# Patient Record
Sex: Female | Born: 1945 | Race: White | Hispanic: No | Marital: Married | Smoking: Never smoker
Health system: Southern US, Community
[De-identification: ages and names within clinical notes are randomized; demographics above are authoritative.]

## PROBLEM LIST (undated history)

## (undated) DIAGNOSIS — M1990 Unspecified osteoarthritis, unspecified site: Secondary | ICD-10-CM

## (undated) DIAGNOSIS — J45909 Unspecified asthma, uncomplicated: Secondary | ICD-10-CM

## (undated) DIAGNOSIS — F0390 Unspecified dementia without behavioral disturbance: Secondary | ICD-10-CM

## (undated) HISTORY — PX: TUBAL LIGATION: SHX77

---

## 2004-07-20 ENCOUNTER — Emergency Department (HOSPITAL_COMMUNITY): Admission: EM | Admit: 2004-07-20 | Discharge: 2004-07-20 | Payer: Self-pay | Admitting: Emergency Medicine

## 2005-04-25 ENCOUNTER — Emergency Department (HOSPITAL_COMMUNITY): Admission: EM | Admit: 2005-04-25 | Discharge: 2005-04-25 | Payer: Self-pay | Admitting: Family Medicine

## 2005-08-15 ENCOUNTER — Emergency Department (HOSPITAL_COMMUNITY): Admission: EM | Admit: 2005-08-15 | Discharge: 2005-08-15 | Payer: Self-pay | Admitting: Emergency Medicine

## 2009-09-20 ENCOUNTER — Emergency Department (HOSPITAL_COMMUNITY): Admission: EM | Admit: 2009-09-20 | Discharge: 2009-09-20 | Payer: Self-pay | Admitting: Family Medicine

## 2011-11-20 ENCOUNTER — Emergency Department (HOSPITAL_COMMUNITY)
Admission: EM | Admit: 2011-11-20 | Discharge: 2011-11-22 | Disposition: A | Discharge status: Other Acute Inpatient Hospital | Payer: Medicare Other | Attending: Emergency Medicine | Admitting: Emergency Medicine

## 2011-11-20 ENCOUNTER — Encounter (HOSPITAL_COMMUNITY): Payer: Self-pay | Admitting: *Deleted

## 2011-11-20 DIAGNOSIS — F603 Borderline personality disorder: Secondary | ICD-10-CM | POA: Insufficient documentation

## 2011-11-20 DIAGNOSIS — R451 Restlessness and agitation: Secondary | ICD-10-CM

## 2011-11-20 DIAGNOSIS — J45909 Unspecified asthma, uncomplicated: Secondary | ICD-10-CM | POA: Insufficient documentation

## 2011-11-20 DIAGNOSIS — IMO0002 Reserved for concepts with insufficient information to code with codable children: Secondary | ICD-10-CM | POA: Insufficient documentation

## 2011-11-20 DIAGNOSIS — Z79899 Other long term (current) drug therapy: Secondary | ICD-10-CM | POA: Insufficient documentation

## 2011-11-20 DIAGNOSIS — F068 Other specified mental disorders due to known physiological condition: Secondary | ICD-10-CM | POA: Insufficient documentation

## 2011-11-20 HISTORY — DX: Unspecified osteoarthritis, unspecified site: M19.90

## 2011-11-20 HISTORY — DX: Unspecified asthma, uncomplicated: J45.909

## 2011-11-20 HISTORY — DX: Unspecified dementia, unspecified severity, without behavioral disturbance, psychotic disturbance, mood disturbance, and anxiety: F03.90

## 2011-11-20 LAB — BASIC METABOLIC PANEL
BUN: 17 mg/dL (ref 6–23)
CO2: 27 mEq/L (ref 19–32)
Calcium: 10.1 mg/dL (ref 8.4–10.5)
Chloride: 104 mEq/L (ref 96–112)
Creatinine, Ser: 0.89 mg/dL (ref 0.50–1.10)
GFR calc Af Amer: 76 mL/min — ABNORMAL LOW (ref >90)
GFR calc non Af Amer: 66 mL/min — ABNORMAL LOW (ref >90)
Glucose, Bld: 105 mg/dL — ABNORMAL HIGH (ref 70–99)
Potassium: 3.9 mEq/L (ref 3.5–5.1)
Sodium: 143 mEq/L (ref 135–145)

## 2011-11-20 LAB — DIFFERENTIAL
Basophils Absolute: 0 10*3/uL (ref 0.0–0.1)
Eosinophils Absolute: 0.1 10*3/uL (ref 0.0–0.7)
Lymphocytes Relative: 19 % (ref 12–46)
Monocytes Absolute: 0.6 10*3/uL (ref 0.1–1.0)
Neutro Abs: 6.5 10*3/uL (ref 1.7–7.7)
Neutrophils Relative %: 74 % (ref 43–77)

## 2011-11-20 LAB — CBC
MCHC: 34 g/dL (ref 30.0–36.0)
Platelets: 248 10*3/uL (ref 150–400)
RDW: 13 % (ref 11.5–15.5)
WBC: 8.8 10*3/uL (ref 4.0–10.5)

## 2011-11-20 MED ORDER — IBUPROFEN 200 MG PO TABS: 600.0000 mg | ORAL_TABLET | Freq: Three times a day (TID) | ORAL | Status: DC | PRN

## 2011-11-20 MED ORDER — ACETAMINOPHEN 325 MG PO TABS
650.0000 mg | ORAL_TABLET | ORAL | Status: DC | PRN
  Administered 2011-11-21: 650 mg via ORAL
  Filled 2011-11-20: qty 2

## 2011-11-20 MED ORDER — ALUM & MAG HYDROXIDE-SIMETH 200-200-20 MG/5ML PO SUSP: 30.0000 mL | ORAL | Status: DC | PRN

## 2011-11-20 MED ORDER — ZIPRASIDONE MESYLATE 20 MG IM SOLR
10.0000 mg | Freq: Once | INTRAMUSCULAR | Status: AC
  Administered 2011-11-20: 10 mg via INTRAMUSCULAR
  Filled 2011-11-20: qty 20

## 2011-11-20 MED ORDER — HALOPERIDOL LACTATE 5 MG/ML IJ SOLN
INTRAMUSCULAR | Status: AC
  Filled 2011-11-20: qty 1

## 2011-11-20 MED ORDER — LORAZEPAM 2 MG/ML IJ SOLN
INTRAMUSCULAR | Status: AC
  Filled 2011-11-20: qty 1

## 2011-11-20 MED ORDER — ONDANSETRON HCL 8 MG PO TABS: 4.0000 mg | ORAL_TABLET | Freq: Three times a day (TID) | ORAL | Status: DC | PRN

## 2011-11-20 NOTE — ED Notes (Signed)
Personal belongings inventoried and secured in ED locker #12

## 2011-11-20 NOTE — ED Notes (Signed)
Pts son, Lashawne Dura, cell phone [phone]
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
## 2011-11-20 NOTE — ED Provider Notes (Signed)
History     CSN: [id number]  Arrival date & time 11/20/11  1246   First MD Initiated Contact with Patient 11/20/11 1401      Chief Complaint  Patient presents with  . Psychiatric Evaluation  . Aggressive Behavior  . Agitation  . Dementia    (Consider location/radiation/quality/duration/timing/severity/associated sxs/prior treatment) HPI The patient presents to the ED today due to altered mental status and increased agitation toward nursing home staff.  She is accompanied by her son.  The patient complains of the staff at her nursing home and keeps referring to "needing to take care of her mother."  The patient states that she is perfectly fine and threatened to leave the hospital.  The son convinced her to stay for the time being, but she refused to allow any physical exam or answer any medical questions.   She has a history of dementia.    Past Medical History  Diagnosis Date  . Dementia   . Asthma   . Arthritis     History reviewed. No pertinent past surgical history.  History reviewed. No pertinent family history.  History  Substance Use Topics  . Smoking status: Not on file  . Smokeless tobacco: Not on file  . Alcohol Use: No    OB History    Grav Para Term Preterm Abortions TAB SAB Ect Mult Living                  Review of Systems  Unable to assess due to patient's unwillingness to respond to medical questions. Allergies  Review of patient's allergies indicates not on file.  Home Medications   Current Outpatient Rx  Name Route Sig Dispense Refill  . DIVALPROEX SODIUM 125 MG PO TBEC Oral Take 125 mg by mouth 2 (two) times daily.    . DONEPEZIL HCL 5 MG PO TABS Oral Take 5 mg by mouth at bedtime.    Marland Kitchen MONTELUKAST SODIUM 10 MG PO TABS Oral Take 10 mg by mouth at bedtime.    Marland Kitchen RISPERIDONE 0.5 MG PO TABS Oral Take 0.5 mg by mouth at bedtime.    . SERTRALINE HCL 100 MG PO TABS Oral Take 100 mg by mouth daily.      BP 115/62  Pulse 88  Temp 99 F (37.2  C) (Oral)  Resp 16  SpO2 96%  Physical Exam  Constitutional: She appears well-developed and well-nourished. No distress.  Neurological: She is alert.  Psychiatric: Her affect is angry and labile. She is agitated and aggressive. Cognition and memory are impaired.    ED Course  Procedures (including critical care time)   Labs Reviewed  CBC WITH DIFFERENTIAL  BASIC METABOLIC PANEL  URINALYSIS, ROUTINE W REFLEX MICROSCOPIC  URINE CULTURE  Patient will not let me examine her because of her agitation. ACT Team will see the patient.  MDM          Carlyle Dolly, PA-C 11/20/11 [phone]

## 2011-11-20 NOTE — ED Provider Notes (Signed)
Medical screening examination/treatment/procedure(s) were performed by non-physician practitioner and as supervising physician I was immediately available for consultation/collaboration.  John-Adam Donnell Beauchamp, M.D.     John-Adam Pauletta Pickney, MD 11/20/11 2223 

## 2011-11-20 NOTE — ED Notes (Signed)
To ED for eval of altered loc. Pt is a new resident at [facility]. Brought to ED for eval of increasing AMS. Pt has a hx of dementia and per NH notes pt has outbursts of agitation and yelling at residence.

## 2011-11-21 ENCOUNTER — Emergency Department (HOSPITAL_COMMUNITY): Payer: Medicare Other

## 2011-11-21 MED ORDER — ZIPRASIDONE MESYLATE 20 MG IM SOLR
10.0000 mg | Freq: Once | INTRAMUSCULAR | Status: AC
Start: 2011-11-21 — End: 2011-11-21
  Administered 2011-11-21: 10 mg via INTRAMUSCULAR
  Filled 2011-11-21: qty 20

## 2011-11-21 MED ORDER — MONTELUKAST SODIUM 10 MG PO TABS
10.0000 mg | ORAL_TABLET | Freq: Every day | ORAL | Status: DC
  Administered 2011-11-21: 10 mg via ORAL
  Filled 2011-11-21: qty 1

## 2011-11-21 MED ORDER — DIVALPROEX SODIUM 125 MG PO DR TAB
125.0000 mg | DELAYED_RELEASE_TABLET | Freq: Two times a day (BID) | ORAL | Status: DC
  Administered 2011-11-21: 125 mg via ORAL
  Filled 2011-11-21 (×2): qty 1

## 2011-11-21 MED ORDER — RISPERIDONE 0.5 MG PO TABS
0.5000 mg | ORAL_TABLET | Freq: Every day | ORAL | Status: DC
  Administered 2011-11-21: 0.5 mg via ORAL
  Filled 2011-11-21: qty 1

## 2011-11-21 MED ORDER — ZIPRASIDONE MESYLATE 20 MG IM SOLR
10.0000 mg | Freq: Two times a day (BID) | INTRAMUSCULAR | Status: DC | PRN
  Administered 2011-11-21: 10 mg via INTRAMUSCULAR

## 2011-11-21 MED ORDER — DONEPEZIL HCL 5 MG PO TABS
5.0000 mg | ORAL_TABLET | Freq: Every day | ORAL | Status: DC
  Administered 2011-11-21: 5 mg via ORAL
  Filled 2011-11-21: qty 1

## 2011-11-21 MED ORDER — LORAZEPAM 2 MG/ML IJ SOLN
2.0000 mg | Freq: Once | INTRAMUSCULAR | Status: AC
  Administered 2011-11-21: 2 mg via INTRAMUSCULAR
  Filled 2011-11-21: qty 1

## 2011-11-21 MED ORDER — SERTRALINE HCL 100 MG PO TABS
100.0000 mg | ORAL_TABLET | Freq: Every day | ORAL | Status: DC
  Administered 2011-11-21: 100 mg via ORAL
  Filled 2011-11-21 (×2): qty 1

## 2011-11-21 NOTE — ED Notes (Signed)
Husband phoned to check on patient. Updated him on events of the night (behavior, restraints, plan of care) in which he verbalized understanding. All questions answered. Encouraged husband to phone back later this morning/early afternoon for further updates on pt's (wife) plan of care.

## 2011-11-21 NOTE — ED Notes (Signed)
Husband Samanthamarie Ezzell cell number [phone]

## 2011-11-21 NOTE — ED Notes (Signed)
Waist band applied to patient while sitting on the edge of bed eating lunch for safety. Sitter remains at bedside.

## 2011-11-21 NOTE — BH Assessment (Signed)
[facility] Assessment Progress Note      Update:  [facility] Dept. cannot transport tonight per Sgt. Paschal to [employer].  Pt will have to be transported in the AM.  Regan Rakers and per Clare @ 2130, pt will still have a bed in the AM.  ED staff to arrange transport in AM.

## 2011-11-21 NOTE — ED Notes (Signed)
Pt sleeping at this time, intermittantly sleeping per sitter at [facility]

## 2011-11-21 NOTE — ED Notes (Signed)
Patient transported to X-ray. Sitter remains at bedside

## 2011-11-21 NOTE — ED Notes (Signed)
Pt updated. Argumentative, confused. Speech non-sensical. Talking of "people due money, donations, not happy, ya'll have not been of help, hungry and ankles hurt, please leave".

## 2011-11-21 NOTE — ED Notes (Signed)
Husband updated via phone.

## 2011-11-21 NOTE — ED Notes (Signed)
[place] [facility] contacted for transport to [facility]. Informed there were five other patients in front of this one to be transported and will go in the morning. Attempted to contact [facility] and [facility] for transport but both counties unable to at this time. [facility] will pick pt up around 1030 in the morning.

## 2011-11-21 NOTE — BH Assessment (Signed)
Assessment Note  Update:  Received call from [facility] from [place] stating pt accepted to [facility] to Dr. Yetta Barre and pt could be transported there.  Updated EDP Campos and ED staff.  Pt to be transported via [place], as pt is IVC.  ED staff to arrange transport and notify pt's family.  Updated assessment disposition, completed assessment notification and faxed to [facility] to log.     Disposition:  Disposition Disposition of Patient: Inpatient treatment program Type of inpatient treatment program: Adult Patient referred to: Other (Comment) (Pt accepted [facility])  On Site Evaluation by:   Reviewed with Physician:  Elsie Lincoln, Rennis Harding 11/21/2011 5:20 PM
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
## 2011-11-21 NOTE — ED Notes (Signed)
Patient has returned from xray 

## 2011-11-21 NOTE — ED Notes (Signed)
Sleeping, sitter at BS. 

## 2011-11-21 NOTE — ED Notes (Signed)
Pt cleaned up and linen changed. Placed back in bed and given a warm blanket. Calmed back down at this time. Sitter remains at the bedside. Lunch reoffered and at the bedside as well.

## 2011-11-22 MED ORDER — HALOPERIDOL LACTATE 5 MG/ML IJ SOLN
INTRAMUSCULAR | Status: AC
  Administered 2011-11-22: 2 mg via INTRAMUSCULAR
  Filled 2011-11-22: qty 1

## 2011-11-22 MED ORDER — HALOPERIDOL LACTATE 5 MG/ML IJ SOLN
2.0000 mg | Freq: Once | INTRAMUSCULAR | Status: AC
  Administered 2011-11-22: 2 mg via INTRAMUSCULAR

## 2011-11-22 NOTE — ED Notes (Addendum)
Husband called and was updated. °

## 2011-11-22 NOTE — Progress Notes (Signed)
7:45 AM Awaiting transfer to [facility] unit.

## 2011-11-22 NOTE — ED Notes (Signed)
Sleeping, sitter at BS, no changes.  

## 2011-11-22 NOTE — ED Notes (Signed)
Sitter and security at bedside

## 2011-11-22 NOTE — ED Notes (Signed)
Sleeping, sitter at BS. 

## 2011-11-22 NOTE — Progress Notes (Signed)
9:03 AM Haldol 2 mg IM for agitation requiring personnel to keep her on her stretcher.

## 2011-11-22 NOTE — ED Notes (Signed)
[facility] here to transport pt to [facility] unit. Pt is much more cooperative and quieter at this time. Security and sitter remain at bedside

## 2011-11-22 NOTE — ED Notes (Signed)
Pt aggitated, walked out to hall. Ambulated pt around hall and attempted to reorient her to her room.  Pt eating breakfast at this time with sitter at bedside. Pt denies pain, [place]

## 2011-11-22 NOTE — ED Notes (Signed)
Sleeping sitter at [facility], no changes, sitter relieved by RN.

## 2011-11-22 NOTE — ED Notes (Signed)
Pt husband called  To check on patient. Pt has started acting out. Walking the halls and yelling. Security to Bedside, and Dr. Ignacia Palma notified. Pt is confused and disoriented x 4

## 2011-12-31 ENCOUNTER — Emergency Department (HOSPITAL_COMMUNITY)
Admission: EM | Admit: 2011-12-31 | Discharge: 2011-12-31 | Disposition: A | Discharge status: Home / Self Care | Payer: Medicare Other | Attending: Emergency Medicine | Admitting: Emergency Medicine

## 2011-12-31 ENCOUNTER — Emergency Department (HOSPITAL_COMMUNITY): Payer: Medicare Other

## 2011-12-31 ENCOUNTER — Encounter (HOSPITAL_COMMUNITY): Payer: Self-pay | Admitting: Emergency Medicine

## 2011-12-31 DIAGNOSIS — R4182 Altered mental status, unspecified: Secondary | ICD-10-CM | POA: Insufficient documentation

## 2011-12-31 DIAGNOSIS — M129 Arthropathy, unspecified: Secondary | ICD-10-CM | POA: Insufficient documentation

## 2011-12-31 DIAGNOSIS — J45909 Unspecified asthma, uncomplicated: Secondary | ICD-10-CM | POA: Insufficient documentation

## 2011-12-31 DIAGNOSIS — N390 Urinary tract infection, site not specified: Secondary | ICD-10-CM

## 2011-12-31 DIAGNOSIS — Z79899 Other long term (current) drug therapy: Secondary | ICD-10-CM | POA: Insufficient documentation

## 2011-12-31 DIAGNOSIS — F0390 Unspecified dementia without behavioral disturbance: Secondary | ICD-10-CM | POA: Insufficient documentation

## 2011-12-31 LAB — CBC WITH DIFFERENTIAL/PLATELET
Basophils Absolute: 0 10*3/uL (ref 0.0–0.1)
Eosinophils Absolute: 0 10*3/uL (ref 0.0–0.7)
Lymphocytes Relative: 23 % (ref 12–46)
Lymphs Abs: 1 10*3/uL (ref 0.7–4.0)
Neutrophils Relative %: 57 % (ref 43–77)
Platelets: 235 10*3/uL (ref 150–400)
RBC: 4.09 MIL/uL (ref 3.87–5.11)
WBC: 4.4 10*3/uL (ref 4.0–10.5)

## 2011-12-31 LAB — COMPREHENSIVE METABOLIC PANEL
ALT: 39 U/L — ABNORMAL HIGH (ref 0–35)
AST: 35 U/L (ref 0–37)
Alkaline Phosphatase: 105 U/L (ref 39–117)
CO2: 24 mEq/L (ref 19–32)
GFR calc Af Amer: >90 mL/min (ref >90)
Glucose, Bld: 119 mg/dL — ABNORMAL HIGH (ref 70–99)
Potassium: 3.6 mEq/L (ref 3.5–5.1)
Sodium: 143 mEq/L (ref 135–145)
Total Protein: 7 g/dL (ref 6.0–8.3)

## 2011-12-31 LAB — URINALYSIS, ROUTINE W REFLEX MICROSCOPIC
Bilirubin Urine: NEGATIVE
Glucose, UA: NEGATIVE mg/dL
Ketones, ur: 15 mg/dL — AB
Nitrite: NEGATIVE
pH: 7 (ref 5.0–8.0)

## 2011-12-31 LAB — URINE MICROSCOPIC-ADD ON

## 2011-12-31 MED ORDER — CEPHALEXIN 500 MG PO CAPS: 500.0000 mg | ORAL_CAPSULE | Freq: Three times a day (TID) | ORAL | Status: DC

## 2011-12-31 MED ORDER — CEPHALEXIN 500 MG PO CAPS: 500.0000 mg | ORAL_CAPSULE | Freq: Two times a day (BID) | ORAL | Status: DC

## 2011-12-31 MED ORDER — DEXTROSE 5 % IV SOLN
1.0000 g | Freq: Once | INTRAVENOUS | Status: AC
  Administered 2011-12-31: 1 g via INTRAVENOUS
  Filled 2011-12-31: qty 10

## 2011-12-31 MED ORDER — SODIUM CHLORIDE 0.9 % IV SOLN
INTRAVENOUS | Status: DC
  Administered 2011-12-31: 14:00:00 via INTRAVENOUS

## 2011-12-31 MED ORDER — SODIUM CHLORIDE 0.9 % IV BOLUS (SEPSIS)
1000.0000 mL | Freq: Once | INTRAVENOUS | Status: AC
  Administered 2011-12-31: 1000 mL via INTRAVENOUS
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
## 2011-12-31 NOTE — ED Notes (Signed)
Report received from Alyssa Grove, RN at this time. Pt to come to CDU 10.

## 2011-12-31 NOTE — ED Notes (Signed)
Pt to CT at this time.

## 2011-12-31 NOTE — ED Notes (Signed)
Tried an in and out cath twice and was unsuccessful in getting urine return

## 2011-12-31 NOTE — ED Notes (Signed)
Patients husband reports that patient stays at [facility] and the patient was admitted to [facility] medical center for a long stay on the behavioral unit. Patient was started on medications to control outburst and once she returned to [facility] her baseline was very different, not walking and feeding herself and not interactive. Wednesday and Thursday of a week and a half ago she started this change and then had a fall on Sunday night a week ago. Staff said the fall wasn't a significant fall and patient was not sent for evaluation after the fall. The past week she has remained not interactive

## 2011-12-31 NOTE — ED Notes (Signed)
Pt returned from CT.  Pascal Lux Wingin, PA at pt bedside.

## 2011-12-31 NOTE — ED Notes (Signed)
Report called to [facility] RN in CDU and moving patient to room 10

## 2012-01-01 NOTE — ED Provider Notes (Signed)
Medical screening examination/treatment/procedure(s) were conducted as a shared visit with non-physician practitioner(s) and myself.  I personally evaluated the patient during the encounter   Roslynn Holte B. Bernette Mayers, MD 01/01/12 [phone]

## 2012-01-02 LAB — URINE CULTURE: Colony Count: >=100000

## 2012-01-03 NOTE — ED Notes (Signed)
+   urine  Patient treated appropriately -sensitive to same-chart appended per protocol MD.  

## 2012-04-08 ENCOUNTER — Encounter (HOSPITAL_COMMUNITY): Payer: Self-pay | Admitting: *Deleted

## 2012-04-08 ENCOUNTER — Inpatient Hospital Stay (HOSPITAL_COMMUNITY)
Admission: EM | Admit: 2012-04-08 | Discharge: 2012-04-11 | DRG: 470 | Disposition: A | Discharge status: Home Health | Payer: Medicare Other | Attending: Internal Medicine | Admitting: Internal Medicine

## 2012-04-08 ENCOUNTER — Emergency Department (HOSPITAL_COMMUNITY): Payer: Medicare Other

## 2012-04-08 DIAGNOSIS — Z7901 Long term (current) use of anticoagulants: Secondary | ICD-10-CM

## 2012-04-08 DIAGNOSIS — E876 Hypokalemia: Secondary | ICD-10-CM | POA: Diagnosis present

## 2012-04-08 DIAGNOSIS — Y9210 Unspecified residential institution as the place of occurrence of the external cause: Secondary | ICD-10-CM | POA: Diagnosis present

## 2012-04-08 DIAGNOSIS — Z79899 Other long term (current) drug therapy: Secondary | ICD-10-CM

## 2012-04-08 DIAGNOSIS — S72002A Fracture of unspecified part of neck of left femur, initial encounter for closed fracture: Secondary | ICD-10-CM

## 2012-04-08 DIAGNOSIS — J45909 Unspecified asthma, uncomplicated: Secondary | ICD-10-CM | POA: Diagnosis present

## 2012-04-08 DIAGNOSIS — S72033A Displaced midcervical fracture of unspecified femur, initial encounter for closed fracture: Principal | ICD-10-CM | POA: Diagnosis present

## 2012-04-08 DIAGNOSIS — X58XXXA Exposure to other specified factors, initial encounter: Secondary | ICD-10-CM | POA: Diagnosis present

## 2012-04-08 DIAGNOSIS — F0390 Unspecified dementia without behavioral disturbance: Secondary | ICD-10-CM | POA: Diagnosis present

## 2012-04-08 LAB — BASIC METABOLIC PANEL
BUN: 18 mg/dL (ref 6–23)
CO2: 26 mEq/L (ref 19–32)
Chloride: 103 mEq/L (ref 96–112)
Creatinine, Ser: 0.67 mg/dL (ref 0.50–1.10)
Glucose, Bld: 128 mg/dL — ABNORMAL HIGH (ref 70–99)
Potassium: 3.7 mEq/L (ref 3.5–5.1)

## 2012-04-08 LAB — CBC WITH DIFFERENTIAL/PLATELET
HCT: 35.8 % — ABNORMAL LOW (ref 36.0–46.0)
Hemoglobin: 11.8 g/dL — ABNORMAL LOW (ref 12.0–15.0)
Lymphocytes Relative: 26 % (ref 12–46)
Lymphs Abs: 2.3 10*3/uL (ref 0.7–4.0)
MCHC: 33 g/dL (ref 30.0–36.0)
Monocytes Absolute: 0.7 10*3/uL (ref 0.1–1.0)
Monocytes Relative: 7 % (ref 3–12)
Neutro Abs: 5.7 10*3/uL (ref 1.7–7.7)
RBC: 3.92 MIL/uL (ref 3.87–5.11)
WBC: 8.8 10*3/uL (ref 4.0–10.5)

## 2012-04-08 LAB — PROTIME-INR
INR: 0.94 (ref 0.00–1.49)
Prothrombin Time: 12.5 seconds (ref 11.6–15.2)

## 2012-04-08 LAB — TYPE AND SCREEN

## 2012-04-08 MED ORDER — LORAZEPAM 2 MG/ML IJ SOLN
1.0000 mg | Freq: Once | INTRAMUSCULAR | Status: AC
  Administered 2012-04-08: 1 mg via INTRAVENOUS
  Filled 2012-04-08: qty 1

## 2012-04-08 MED ORDER — MORPHINE SULFATE 4 MG/ML IJ SOLN
6.0000 mg | Freq: Once | INTRAMUSCULAR | Status: AC
  Administered 2012-04-08: 6 mg via INTRAVENOUS
  Filled 2012-04-08: qty 2
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
## 2012-04-08 NOTE — ED Provider Notes (Addendum)
History     CSN: [id number]  Arrival date & time 04/08/12  2028   First MD Initiated Contact with Patient 04/08/12 2134      Chief Complaint  Patient presents with  . Hip Pain    (Consider location/radiation/quality/duration/timing/severity/associated sxs/prior treatment) The history is provided by the patient.  Kiara Gilbert is a 67 y.o. female hx of dementia in [place] here with L hip pain. As per husband, she has been having intermittent left hip pain. She has not been walking well since late December. She was not progressing well with physical therapy so an x-ray was done in nursing home that showed a left hip fracture.   Level V caveat- dementia   Past Medical History  Diagnosis Date  . Dementia   . Asthma   . Arthritis     History reviewed. No pertinent past surgical history.  No family history on file.  History  Substance Use Topics  . Smoking status: Not on file  . Smokeless tobacco: Not on file  . Alcohol Use: No    OB History    Grav Para Term Preterm Abortions TAB SAB Ect Mult Living                  Review of Systems  Musculoskeletal:       L leg pain   All other systems reviewed and are negative.    Allergies  Review of patient's allergies indicates no known allergies.  Home Medications   Current Outpatient Rx  Name  Route  Sig  Dispense  Refill  . ACETAMINOPHEN 500 MG PO TABS   Oral   Take 500 mg by mouth 3 (three) times daily.         . ALBUTEROL SULFATE HFA 108 (90 BASE) MCG/ACT IN AERS   Inhalation   Inhale 2 puffs into the lungs every 4 (four) hours as needed. For shortness of breath         . ALPRAZOLAM 0.25 MG PO TABS   Oral   Take 0.5 mg by mouth at bedtime.          . ALPRAZOLAM 0.25 MG PO TABS   Oral   Take 0.25 mg by mouth 3 (three) times daily as needed. For agitation         . CRANBERRY 425 MG PO CAPS   Oral   Take 1 capsule by mouth daily.         . DONEPEZIL HCL 10 MG PO TABS   Oral   Take  10 mg by mouth at bedtime.         . FOLGARD PO TABS   Oral   Take 1 tablet by mouth daily.         Marland Kitchen LACTINEX PO CHEW   Oral   Chew 4 tablets by mouth 3 (three) times daily with meals.         Marland Kitchen MONTELUKAST SODIUM 10 MG PO TABS   Oral   Take 10 mg by mouth at bedtime.         Marland Kitchen POLYETHYLENE GLYCOL 3350 PO PACK   Oral   Take 17 g by mouth daily.         Marland Kitchen POTASSIUM CHLORIDE CRYS ER 20 MEQ PO TBCR   Oral   Take 20 mEq by mouth every morning.         Marland Kitchen SERTRALINE HCL 100 MG PO TABS   Oral   Take 100 mg by mouth  every morning.            BP 127/65  Pulse 84  Temp 98.9 F (37.2 C)  Resp 18  SpO2 97%  Physical Exam  Nursing note and vitals reviewed. Constitutional: She appears well-developed and well-nourished.       Calm,   HENT:  Head: Normocephalic.  Mouth/Throat: Oropharynx is clear and moist.  Eyes: Conjunctivae normal are normal. Pupils are equal, round, and reactive to light.  Neck: Normal range of motion. Neck supple.  Cardiovascular: Normal rate, regular rhythm and normal heart sounds.   Pulmonary/Chest: Effort normal and breath sounds normal. No respiratory distress. She has no wheezes. She has no rales.  Abdominal: Soft. Bowel sounds are normal. She exhibits no distension. There is no tenderness. There is no rebound.  Musculoskeletal:       L hip shortened. 2+ pulses. Able to move ankle.   Neurological: She is alert.       Demented   Skin: Skin is warm.  Psychiatric:       Unable     ED Course  Procedures (including critical care time)  Labs Reviewed  CBC WITH DIFFERENTIAL - Abnormal; Notable for the following:    Hemoglobin 11.8 (*)     HCT 35.8 (*)     All other components within normal limits  BASIC METABOLIC PANEL - Abnormal; Notable for the following:    Glucose, Bld 128 (*)     GFR calc non Af Amer 88 (*)     All other components within normal limits  TYPE AND SCREEN  PROTIME-INR   Dg Hip Complete Left  04/08/2012   *RADIOLOGY REPORT*  Clinical Data: Left hip pain; known left hip fracture.  LEFT HIP - COMPLETE 2+ VIEW  Comparison: None.  Findings: There is a markedly displaced transcervical fracture involving the left femoral neck, with well more than one shaft width superior displacement of the distal femur.  Surrounding heterotopic bone formation suggests that this fracture is subacute or chronic.  The left femoral head remains seated at the acetabulum.  No additional fractures are seen.  The right hip joint is grossly unremarkable in appearance.  Mild sclerotic change is seen at the sacroiliac joints.  The visualized bowel gas pattern is grossly unremarkable.  IMPRESSION: Markedly displaced transcervical fracture involving the left femoral neck, with well more than one shaft width superior displacement of the distal femur.  Surrounding heterotopic bone formation suggests that this fracture is subacute or chronic.   Original Report Authenticated By: Tonia Ghent, M.D.      No diagnosis found.   Date: 04/08/2012  Rate: 93  Rhythm: normal sinus rhythm  QRS Axis: normal  Intervals: normal  ST/T Wave abnormalities: normal  Conduction Disutrbances:none  Narrative Interpretation:   Old EKG Reviewed: unchanged    MDM  Kiara Gilbert is a 67 y.o. female here with L hip pain. Xray showed L femoral neck fracture that appears chronic. Will get ortho consult and get preop labs.   11:05 PM I called Dr. Shon Baton, ortho, who will see patient tomorrow. Will keep NPO after midnight. Will admit to medicine. I called Dr. Toniann Fail who will accepted her on his service.          Richardean Canal, MD 04/08/12 2313  Richardean Canal, MD 04/08/12 [phone]

## 2012-04-09 ENCOUNTER — Encounter (HOSPITAL_COMMUNITY)
Admission: EM | Disposition: A | Discharge status: Home Health | Payer: Self-pay | Source: Home / Self Care | Attending: Internal Medicine

## 2012-04-09 ENCOUNTER — Inpatient Hospital Stay (HOSPITAL_COMMUNITY): Payer: Medicare Other | Admitting: Anesthesiology

## 2012-04-09 ENCOUNTER — Encounter (HOSPITAL_COMMUNITY): Payer: Self-pay | Admitting: Internal Medicine

## 2012-04-09 ENCOUNTER — Encounter (HOSPITAL_COMMUNITY): Payer: Self-pay | Admitting: Anesthesiology

## 2012-04-09 DIAGNOSIS — F0390 Unspecified dementia without behavioral disturbance: Secondary | ICD-10-CM | POA: Diagnosis present

## 2012-04-09 DIAGNOSIS — S72009A Fracture of unspecified part of neck of unspecified femur, initial encounter for closed fracture: Secondary | ICD-10-CM

## 2012-04-09 DIAGNOSIS — S72002A Fracture of unspecified part of neck of left femur, initial encounter for closed fracture: Secondary | ICD-10-CM | POA: Diagnosis present

## 2012-04-09 DIAGNOSIS — J45909 Unspecified asthma, uncomplicated: Secondary | ICD-10-CM | POA: Diagnosis present

## 2012-04-09 HISTORY — PX: HIP ARTHROPLASTY: SHX981

## 2012-04-09 LAB — URINALYSIS, MICROSCOPIC ONLY
Ketones, ur: NEGATIVE mg/dL
Leukocytes, UA: NEGATIVE
Protein, ur: NEGATIVE mg/dL
Urobilinogen, UA: 0.2 mg/dL (ref 0.0–1.0)

## 2012-04-09 LAB — CBC
Hemoglobin: 10.9 g/dL — ABNORMAL LOW (ref 12.0–15.0)
MCH: 29.2 pg (ref 26.0–34.0)
MCV: 91.4 fL (ref 78.0–100.0)
Platelets: 274 10*3/uL (ref 150–400)
RBC: 3.73 MIL/uL — ABNORMAL LOW (ref 3.87–5.11)

## 2012-04-09 LAB — BASIC METABOLIC PANEL
CO2: 26 mEq/L (ref 19–32)
Calcium: 8.8 mg/dL (ref 8.4–10.5)
Glucose, Bld: 107 mg/dL — ABNORMAL HIGH (ref 70–99)
Sodium: 141 mEq/L (ref 135–145)

## 2012-04-09 LAB — GLUCOSE, CAPILLARY: Glucose-Capillary: 85 mg/dL (ref 70–99)

## 2012-04-09 SURGERY — HEMIARTHROPLASTY, HIP, DIRECT ANTERIOR APPROACH, FOR FRACTURE
Anesthesia: General | Site: Hip | Laterality: Left | Wound class: Clean

## 2012-04-09 MED ORDER — FENTANYL CITRATE 0.05 MG/ML IJ SOLN
INTRAMUSCULAR | Status: DC | PRN
  Administered 2012-04-09: 100 ug via INTRAVENOUS

## 2012-04-09 MED ORDER — ONDANSETRON HCL 4 MG/2ML IJ SOLN: 4.0000 mg | Freq: Three times a day (TID) | INTRAMUSCULAR | Status: DC | PRN

## 2012-04-09 MED ORDER — HYDROMORPHONE HCL PF 1 MG/ML IJ SOLN: 1.0000 mg | INTRAMUSCULAR | Status: DC | PRN

## 2012-04-09 MED ORDER — ACETAMINOPHEN 650 MG RE SUPP: 650.0000 mg | Freq: Four times a day (QID) | RECTAL | Status: DC | PRN

## 2012-04-09 MED ORDER — SODIUM CHLORIDE 0.9 % IV SOLN
INTRAVENOUS | Status: AC
  Administered 2012-04-09: 01:00:00 via INTRAVENOUS

## 2012-04-09 MED ORDER — MONTELUKAST SODIUM 10 MG PO TABS
10.0000 mg | ORAL_TABLET | Freq: Every day | ORAL | Status: DC
  Administered 2012-04-10: 10 mg via ORAL
  Filled 2012-04-09 (×4): qty 1

## 2012-04-09 MED ORDER — LIDOCAINE HCL (CARDIAC) 20 MG/ML IV SOLN
INTRAVENOUS | Status: DC | PRN
  Administered 2012-04-09: 80 mg via INTRAVENOUS

## 2012-04-09 MED ORDER — ALPRAZOLAM 0.5 MG PO TABS
0.5000 mg | ORAL_TABLET | Freq: Every day | ORAL | Status: DC
  Administered 2012-04-10: 0.5 mg via ORAL
  Filled 2012-04-09 (×3): qty 1

## 2012-04-09 MED ORDER — POTASSIUM CHLORIDE CRYS ER 20 MEQ PO TBCR
20.0000 meq | EXTENDED_RELEASE_TABLET | Freq: Every morning | ORAL | Status: DC
  Administered 2012-04-10 – 2012-04-11 (×2): 20 meq via ORAL
  Filled 2012-04-09: qty 3
  Filled 2012-04-09 (×4): qty 1

## 2012-04-09 MED ORDER — ACETAMINOPHEN 325 MG PO TABS: 650.0000 mg | ORAL_TABLET | Freq: Four times a day (QID) | ORAL | Status: DC | PRN

## 2012-04-09 MED ORDER — SUCCINYLCHOLINE CHLORIDE 20 MG/ML IJ SOLN
INTRAMUSCULAR | Status: DC | PRN
  Administered 2012-04-09: 100 mg via INTRAVENOUS

## 2012-04-09 MED ORDER — DEXTROSE-NACL 5-0.9 % IV SOLN
INTRAVENOUS | Status: DC
  Administered 2012-04-10: 20:00:00 via INTRAVENOUS

## 2012-04-09 MED ORDER — PROPOFOL 10 MG/ML IV BOLUS
INTRAVENOUS | Status: DC | PRN
  Administered 2012-04-09: 120 mg via INTRAVENOUS

## 2012-04-09 MED ORDER — LACTINEX PO CHEW
4.0000 | CHEWABLE_TABLET | Freq: Three times a day (TID) | ORAL | Status: DC
  Administered 2012-04-10 – 2012-04-11 (×3): 4 via ORAL
  Filled 2012-04-09 (×11): qty 4

## 2012-04-09 MED ORDER — HYDROMORPHONE HCL PF 1 MG/ML IJ SOLN
0.5000 mg | INTRAMUSCULAR | Status: DC | PRN
  Administered 2012-04-09: 0.5 mg via INTRAVENOUS
  Filled 2012-04-09: qty 1

## 2012-04-09 MED ORDER — CEFAZOLIN SODIUM 1-5 GM-% IV SOLN
INTRAVENOUS | Status: DC | PRN
  Administered 2012-04-09: 1 g via INTRAVENOUS

## 2012-04-09 MED ORDER — LACTATED RINGERS IV SOLN
INTRAVENOUS | Status: DC | PRN
  Administered 2012-04-09: 22:00:00 via INTRAVENOUS

## 2012-04-09 MED ORDER — ONDANSETRON HCL 4 MG/2ML IJ SOLN: 4.0000 mg | Freq: Four times a day (QID) | INTRAMUSCULAR | Status: DC | PRN

## 2012-04-09 MED ORDER — SODIUM CHLORIDE 0.9 % IV SOLN
10.0000 mg | INTRAVENOUS | Status: DC | PRN
  Administered 2012-04-09: 25 ug/min via INTRAVENOUS

## 2012-04-09 MED ORDER — ALPRAZOLAM 0.25 MG PO TABS
0.2500 mg | ORAL_TABLET | Freq: Three times a day (TID) | ORAL | Status: DC | PRN
  Administered 2012-04-09 – 2012-04-11 (×4): 0.25 mg via ORAL
  Filled 2012-04-09 (×4): qty 1

## 2012-04-09 MED ORDER — POLYETHYLENE GLYCOL 3350 17 G PO PACK
17.0000 g | PACK | Freq: Every day | ORAL | Status: DC
  Administered 2012-04-10 – 2012-04-11 (×2): 17 g via ORAL
  Filled 2012-04-09 (×3): qty 1

## 2012-04-09 MED ORDER — SODIUM CHLORIDE 0.9 % IR SOLN
Status: DC | PRN
  Administered 2012-04-09: 1000 mL

## 2012-04-09 MED ORDER — ONDANSETRON HCL 4 MG/2ML IJ SOLN
INTRAMUSCULAR | Status: DC | PRN
  Administered 2012-04-09: 4 mg via INTRAVENOUS

## 2012-04-09 MED ORDER — ONDANSETRON HCL 4 MG PO TABS: 4.0000 mg | ORAL_TABLET | Freq: Four times a day (QID) | ORAL | Status: DC | PRN

## 2012-04-09 MED ORDER — DONEPEZIL HCL 10 MG PO TABS
10.0000 mg | ORAL_TABLET | Freq: Every day | ORAL | Status: DC
  Administered 2012-04-10: 10 mg via ORAL
  Filled 2012-04-09 (×4): qty 1

## 2012-04-09 MED ORDER — ALBUTEROL SULFATE HFA 108 (90 BASE) MCG/ACT IN AERS: 2.0000 | INHALATION_SPRAY | RESPIRATORY_TRACT | Status: DC | PRN

## 2012-04-09 MED ORDER — SERTRALINE HCL 100 MG PO TABS
100.0000 mg | ORAL_TABLET | Freq: Every morning | ORAL | Status: DC
  Administered 2012-04-10 – 2012-04-11 (×2): 100 mg via ORAL
  Filled 2012-04-09 (×3): qty 1

## 2012-04-09 SURGICAL SUPPLY — 58 items
ADH SKN CLS APL DERMABOND .7 (GAUZE/BANDAGES/DRESSINGS) ×1
BLADE SAW SAG 73X25 THK (BLADE) ×1
BLADE SAW SGTL 73X25 THK (BLADE) ×1 IMPLANT
BNDG COHESIVE 6X5 TAN STRL LF (GAUZE/BANDAGES/DRESSINGS) ×1 IMPLANT
BRUSH FEMORAL CANAL (MISCELLANEOUS) IMPLANT
CATH URET WHISTLE 8FR 331008 (CATHETERS) ×1 IMPLANT
CLOTH BEACON ORANGE TIMEOUT ST (SAFETY) ×2 IMPLANT
COVER SURGICAL LIGHT HANDLE (MISCELLANEOUS) ×2 IMPLANT
DERMABOND ADVANCED (GAUZE/BANDAGES/DRESSINGS) ×1
DERMABOND ADVANCED .7 DNX12 (GAUZE/BANDAGES/DRESSINGS) IMPLANT
DRAPE INCISE IOBAN 66X45 STRL (DRAPES) ×1 IMPLANT
DRAPE INCISE IOBAN 85X60 (DRAPES) ×3 IMPLANT
DRAPE ORTHO SPLIT 77X108 STRL (DRAPES) ×4
DRAPE SURG ORHT 6 SPLT 77X108 (DRAPES) ×2 IMPLANT
DRAPE U-SHAPE 47X51 STRL (DRAPES) ×2 IMPLANT
DRSG MEPILEX BORDER 4X12 (GAUZE/BANDAGES/DRESSINGS) ×1 IMPLANT
DRSG MEPILEX BORDER 4X8 (GAUZE/BANDAGES/DRESSINGS) ×2 IMPLANT
DURAPREP 26ML APPLICATOR (WOUND CARE) ×2 IMPLANT
ELECT BLADE 4.0 EZ CLEAN MEGAD (MISCELLANEOUS) ×2
ELECT REM PT RETURN 9FT ADLT (ELECTROSURGICAL) ×2
ELECTRODE BLDE 4.0 EZ CLN MEGD (MISCELLANEOUS) IMPLANT
ELECTRODE REM PT RTRN 9FT ADLT (ELECTROSURGICAL) ×1 IMPLANT
EVACUATOR 1/8 PVC DRAIN (DRAIN) ×2 IMPLANT
FACESHIELD LNG OPTICON STERILE (SAFETY) ×4 IMPLANT
GLOVE BIOGEL PI IND STRL 7.5 (GLOVE) ×1 IMPLANT
GLOVE BIOGEL PI IND STRL 8 (GLOVE) ×2 IMPLANT
GLOVE BIOGEL PI INDICATOR 7.5 (GLOVE) ×1
GLOVE BIOGEL PI INDICATOR 8 (GLOVE) ×2
GLOVE ORTHO TXT STRL SZ7.5 (GLOVE) ×3 IMPLANT
GLOVE SURG ORTHO 8.0 STRL STRW (GLOVE) ×2 IMPLANT
GLOVE SURG SS PI 7.5 STRL IVOR (GLOVE) ×1 IMPLANT
GOWN STRL NON-REIN LRG LVL3 (GOWN DISPOSABLE) ×6 IMPLANT
HANDPIECE INTERPULSE COAX TIP (DISPOSABLE)
IMMOBILIZER KNEE 22 UNIV (SOFTGOODS) ×2 IMPLANT
KIT BASIN OR (CUSTOM PROCEDURE TRAY) ×2 IMPLANT
KIT ROOM TURNOVER OR (KITS) ×2 IMPLANT
MANIFOLD NEPTUNE II (INSTRUMENTS) ×2 IMPLANT
NS IRRIG 1000ML POUR BTL (IV SOLUTION) ×2 IMPLANT
PACK TOTAL JOINT (CUSTOM PROCEDURE TRAY) ×2 IMPLANT
PAD ARMBOARD 7.5X6 YLW CONV (MISCELLANEOUS) ×4 IMPLANT
PILLOW ABDUCTION HIP (SOFTGOODS) ×1 IMPLANT
PRESSURIZER FEMORAL UNIV (MISCELLANEOUS) IMPLANT
SET HNDPC FAN SPRY TIP SCT (DISPOSABLE) IMPLANT
SPECIMEN JAR MEDIUM (MISCELLANEOUS) ×2 IMPLANT
SPONGE LAP 4X18 X RAY DECT (DISPOSABLE) ×4 IMPLANT
STAPLER VISISTAT (STAPLE) ×2 IMPLANT
STAPLER VISISTAT 35W (STAPLE) IMPLANT
STRIP CLOSURE SKIN 1/2X4 (GAUZE/BANDAGES/DRESSINGS) ×2 IMPLANT
SUT MNCRL AB 4-0 PS2 18 (SUTURE) ×1 IMPLANT
SUT VIC AB 1 CT1 27 (SUTURE) ×6
SUT VIC AB 1 CT1 27XBRD ANBCTR (SUTURE) IMPLANT
SUT VIC AB 2-0 CT1 27 (SUTURE) ×4
SUT VIC AB 2-0 CT1 TAPERPNT 27 (SUTURE) IMPLANT
TOWEL OR 17X24 6PK STRL BLUE (TOWEL DISPOSABLE) ×2 IMPLANT
TOWEL OR 17X26 10 PK STRL BLUE (TOWEL DISPOSABLE) ×2 IMPLANT
TOWER CARTRIDGE SMART MIX (DISPOSABLE) IMPLANT
TRAY FOLEY CATH 14FR (SET/KITS/TRAYS/PACK) ×1 IMPLANT
WATER STERILE IRR 1000ML POUR (IV SOLUTION) ×8 IMPLANT

## 2012-04-09 NOTE — Progress Notes (Signed)
Patient ID: Kiara Gilbert, female   DOB: [birth year], 67 y.o.   MRN: [id number]   Asked to assist with definitive management of [name] left hip fracture.  Family known to me from past history of soccer.  Chart and history reviewed, significant for dementia  Displaced femoral neck fracture  Plan to go OR for palliative goals of pain support and improved function.  Plan for left hip hemiarthroplasty.  Reviewed risks and benefits with her husband, consent obtained for management of hip fracture.

## 2012-04-09 NOTE — Progress Notes (Signed)
INITIAL NUTRITION ASSESSMENT  DOCUMENTATION CODES Per approved criteria  -Not Applicable   INTERVENTION: When medically ready, advance diet to Mechanical soft with thin liquids  Add Ensure Complete bid when diet advanced.  NUTRITION DIAGNOSIS:  Inadequate oral intake related to inability to eat as evidenced by npo status.   Goal: Intake to meet >/=90% estimated needs  Monitor:  Diet advancement, labs, weight trend  Reason for Assessment: MST  67 y.o. female  Admitting Dx: Left displaced femoral neck fracture  ASSESSMENT: Patient from a dementia facility admitted with hip fx.  Spoke with patient's husband. Patient was on a Mechanical soft diet with thin liquids and Ensure tid  prior to admit.  Husband reported that she was on thick liquids in the past but had been off that for some time.  Patient usually ate well and no known weight loss and has always been slender.  Height: Ht Readings from Last 1 Encounters:  04/09/12 5\' 2"  (1.575 m)    Weight: Wt Readings from Last 1 Encounters:  04/09/12 116 lb 4.8 oz (52.753 kg)    Ideal Body Weight: 110 lbs  % Ideal Body Weight: 105  Wt Readings from Last 10 Encounters:  04/09/12 116 lb 4.8 oz (52.753 kg)    Usual Body Weight: unknown  % Usual Body Weight: 100 per husband  BMI:  Body mass index is 21.27 kg/(m^2).  Estimated Nutritional Needs: Kcal: 1350-1550 Protein: 60-70 gm Fluid: 1.4-1.5 L daily  Skin: intact  Diet Order: NPO  EDUCATION NEEDS: -No education needs identified at this time   Intake/Output Summary (Last 24 hours) at 04/09/12 0934 Last data filed at 04/09/12 0500  Gross per 24 hour  Intake    300 ml  Output    300 ml  Net      0 ml    Labs:   Lab 04/09/12 0551 04/08/12 2149  NA 141 140  K 3.8 3.7  CL 104 103  CO2 26 26  BUN 15 18  CREATININE 0.52 0.67  CALCIUM 8.8 9.2  MG -- --  PHOS -- --  GLUCOSE 107* 128*    CBG (last 3)   Basename 04/09/12 0606  GLUCAP 101*     Scheduled Meds:   . sodium chloride   Intravenous STAT  . ALPRAZolam  0.5 mg Oral QHS  . donepezil  10 mg Oral QHS  . lactobacillus acidophilus & bulgar  4 tablet Oral TID WC  . montelukast  10 mg Oral QHS  . polyethylene glycol  17 g Oral Daily  . potassium chloride SA  20 mEq Oral q morning - 10a  . sertraline  100 mg Oral q morning - 10a    Continuous Infusions:   . dextrose 5 % and 0.9% NaCl      Past Medical History  Diagnosis Date  . Dementia   . Asthma   . Arthritis     Past Surgical History  Procedure Date  . Tubal ligation     Oran Rein, RD, LDN Clinical Inpatient Dietitian Pager:  [phone] Weekend and after hours pager:  [phone]

## 2012-04-09 NOTE — Anesthesia Procedure Notes (Signed)
Procedure Name: Intubation Date/Time: 04/09/2012 10:19 PM Performed by: Molli Hazard Pre-anesthesia Checklist: Patient identified, Emergency Drugs available, Suction available and Patient being monitored Patient Re-evaluated:Patient Re-evaluated prior to inductionOxygen Delivery Method: Circle system utilized Preoxygenation: Pre-oxygenation with 100% oxygen Intubation Type: IV induction and Rapid sequence Laryngoscope Size: Miller and 2 Grade View: Grade I Tube type: Oral Tube size: 7.5 mm Number of attempts: 1 Airway Equipment and Method: Stylet Placement Confirmation: ETT inserted through vocal cords under direct vision,  positive ETCO2 and breath sounds checked- equal and bilateral Secured at: 21 cm Tube secured with: Tape Dental Injury: Teeth and Oropharynx as per pre-operative assessment

## 2012-04-09 NOTE — Anesthesia Preprocedure Evaluation (Signed)
Anesthesia Evaluation  Patient identified by MRN, date of birth, ID band Patient confused    Reviewed: Allergy & Precautions, H&P , NPO status , Patient's Chart, lab work & pertinent test results  Airway Mallampati: I TM Distance: >3 FB Neck ROM: Full    Dental   Pulmonary asthma ,  breath sounds clear to auscultation        Cardiovascular Rhythm:Regular Rate:Normal     Neuro/Psych dementia   GI/Hepatic   Endo/Other    Renal/GU      Musculoskeletal   Abdominal   Peds  Hematology anemic   Anesthesia Other Findings   Reproductive/Obstetrics                           Anesthesia Physical Anesthesia Plan  ASA: III and emergent  Anesthesia Plan: General   Post-op Pain Management:    Induction: Intravenous  Airway Management Planned: Oral ETT  Additional Equipment:   Intra-op Plan:   Post-operative Plan: Extubation in OR  Informed Consent: I have reviewed the patients History and Physical, chart, labs and discussed the procedure including the risks, benefits and alternatives for the proposed anesthesia with the patient or authorized representative who has indicated his/her understanding and acceptance.     Plan Discussed with: CRNA and Surgeon  Anesthesia Plan Comments:         Anesthesia Quick Evaluation

## 2012-04-09 NOTE — Op Note (Signed)
NAME:  Kiara Gilbert, Kiara Gilbert NO.:  [id number]   MEDICAL RECORD NO.: [id number]   LOCATION:  1435                         FACILITY:  [facility]   DATE OF BIRTH:  04/04/1945  PHYSICIAN:  Madlyn Frankel. Charlann Boxer, M.D.     DATE OF PROCEDURE:  04/09/2012                               OPERATIVE REPORT     PREOPERATIVE DIAGNOSIS:  Left displaced femoral neck fracture.   POSTOPERATIVE DIAGNOSIS:  Left displaced femoral neck fracture.   PROCEDURE:  Left hip hemiarthroplasty utilizing DePuy component, size 2 Standard Summit Basic stem with a 41mm unipolar ball with a +0 adapter.   SURGEON:  Madlyn Frankel. Charlann Boxer, MD   ASSISTANT:  Lanney Gins, PA-C.   ANESTHESIA:  General.   SPECIMENS:  None.   DRAINS:  One medium Hemovac.   BLOOD LOSS:  About 200 cc.   COMPLICATIONS:  None.   INDICATION OF PROCEDURE:  Kiara Gilbert is a 67 year old female with significant dementia residing in facility which manages patients with dementia.  The history of falls is unclear in duration but could have been as long ago as early December.  Nonetheless xrays of left hip reveal left femoral neck fracture with significant proximal migration of the femoral shaft indicating extended duration.  She was admitted to the hospital under the medical service after radiographs revealed a femoral neck fracture.  She was seen and evaluated and was scheduled for surgery for fixation.  The necessity of surgical repair was discussed with she and her family.  Consent was obtained after reviewing risks of infection, DVT, component failure, and need for revision surgery.   PROCEDURE IN DETAIL:  The patient was brought to the operative theater. Once adequate anesthesia, preoperative antibiotics, 1 g of Ancef administered, the patient was positioned into the right lateral decubitus position with the left side up.  The left lower extremity was then prepped and draped in sterile fashion.  A time-out was performed identifying  the patient, planned procedure, and extremity.   A lateral incision was made off the proximal trochanter. Sharp dissection was carried down to the iliotibial band and gluteal fascia. The gluteal fascia was then incised for posterior approach.  The short external rotators were taken down separate from the posterior capsule. An L capsulotomy was made preserving the posterior leaflet for later anatomic repair. Fracture site was identified and after removing comminuted segments of the posterior femoral neck, the femoral head was removed without difficulty and measured on the back table  using the sizing rings and determined to be 41 mm in diameter.   The proximal femur was then exposed.  Retractors placed.  I then drilled, opened the proximal femur.  Then I hand reamed once and  Irrigated the canal to try to prevent fat emboli.  I began broaching the femur with the Summit Basic set starting with a 1 broach up to a size 2 broach with good medial and lateral metaphyseal fit without evidence of any torsion or movement.  A trial reduction was carried out with a standard neck and a +0 adapter with a 41 ball.  The hip reduced with some effort as anticipated due to significant  pre-operative shortening.  Based on the reduction, her pre-operative co-morbidities and goal of palliative efforts at restoring some function I was satisfied with the reduction and hip stability.   The hip went through a range of motion without evidence of any subluxation or impingement.   Given these findings, the trial components removed.  The final 2 Standard Summit Basic  stem was opened.  After irrigating the canal, the final stem was impacted and sat at the level where the broach was. Based on this and the trial reduction, a +0 adapter was opened and impacted in the 41 unipolar ball onto a clean and dry trunnion.  The hip had been irrigated throughout the case and again at this point.  I re- Approximated the posterior  capsule to the superior leaflet using a  #1 Vicryl,  and placed a medium Hemovac drain deep.  The remainder of the wound was closed with #1 Vicryl in the iliotibial band and gluteal fascia, a  2-0 Vicryl in the sub-Q tissue and a running 4-0 Monocryl in the skin.  The hip was cleaned, dried, and dressed sterilely using Dermabond and Mepilex dressing.  Drain site was dressed separately.  Based on her observed pre-operative flexion and internal rotation contractures I placed her in an abduction device to try and prevent early complications. She was then brought to recovery room, extubated in stable condition, tolerating the procedure well.  Lanney Gins, PA-C was present and utilized as Geophysicist/field seismologist for the entire case from  Preoperative positioning to management of the contralateral extremity and retractors to  General facilitation of the procedure.  He was also involved with primary wound closure.         Madlyn Frankel Charlann Boxer, M.D.

## 2012-04-09 NOTE — H&P (Signed)
Kiara Gilbert is an 67 y.o. female.   Patient was seen and examined on April 09, 2012. PCP - Dr. Florentina Jenny. History obtained from patient's husband Mr. Kiara Gilbert. Chief Complaint: Fracture of the left hip. HPI: 67 year-old female who lives in [facility] dementia facility was brought today after x-rays over there revealed left hip fracture. Patient has patient's husband has not been walking as usual and x-rays were done today which revealed the fracture. Patient has had multiple falls previously the last one was on December 6. At this time Dr. Shon Baton of orthopedics was consulted by ER physician and patient will be admitted for further management for possible surgery.  Past Medical History  Diagnosis Date  . Dementia   . Asthma   . Arthritis     Past Surgical History  Procedure Date  . Tubal ligation     History reviewed. No pertinent family history. Social History:  reports that she has never smoked. She does not have any smokeless tobacco history on file. She reports that she does not drink alcohol or use illicit drugs.  Allergies: No Known Allergies   (Not in a hospital admission)  Results for orders placed during the hospital encounter of 04/08/12 (from the past 48 hour(s))  CBC WITH DIFFERENTIAL     Status: Abnormal   Collection Time   04/08/12  9:49 PM      Component Value Range Comment   WBC 8.8  4.0 - 10.5 K/uL    RBC 3.92  3.87 - 5.11 MIL/uL    Hemoglobin 11.8 (*) 12.0 - 15.0 g/dL    HCT 78.2 (*) 95.6 - 46.0 %    MCV 91.3  78.0 - 100.0 fL    MCH 30.1  26.0 - 34.0 pg    MCHC 33.0  30.0 - 36.0 g/dL    RDW 21.3  08.6 - 57.8 %    Platelets 301  150 - 400 K/uL    Neutrophils Relative 66  43 - 77 %    Neutro Abs 5.7  1.7 - 7.7 K/uL    Lymphocytes Relative 26  12 - 46 %    Lymphs Abs 2.3  0.7 - 4.0 K/uL    Monocytes Relative 7  3 - 12 %    Monocytes Absolute 0.7  0.1 - 1.0 K/uL    Eosinophils Relative 1  0 - 5 %    Eosinophils Absolute 0.1  0.0 - 0.7 K/uL    Basophils Relative 0  0 - 1 %    Basophils Absolute 0.0  0.0 - 0.1 K/uL   BASIC METABOLIC PANEL     Status: Abnormal   Collection Time   04/08/12  9:49 PM      Component Value Range Comment   Sodium 140  135 - 145 mEq/L    Potassium 3.7  3.5 - 5.1 mEq/L    Chloride 103  96 - 112 mEq/L    CO2 26  19 - 32 mEq/L    Glucose, Bld 128 (*) 70 - 99 mg/dL    BUN 18  6 - 23 mg/dL    Creatinine, Ser 4.69  0.50 - 1.10 mg/dL    Calcium 9.2  8.4 - 62.9 mg/dL    GFR calc non Af Amer 88 (*) >90 mL/min    GFR calc Af Amer >90  >90 mL/min   PROTIME-INR     Status: Normal   Collection Time   04/08/12  9:49 PM  Component Value Range Comment   Prothrombin Time 12.5  11.6 - 15.2 seconds    INR 0.94  0.00 - 1.49   TYPE AND SCREEN     Status: Normal   Collection Time   04/08/12 10:15 PM      Component Value Range Comment   ABO/RH(D) B POS      Antibody Screen NEG      Sample Expiration 04/11/2012     ABO/RH     Status: Normal (Preliminary result)   Collection Time   04/08/12 10:15 PM      Component Value Range Comment   ABO/RH(D) B POS      Dg Hip Complete Left  04/08/2012  *RADIOLOGY REPORT*  Clinical Data: Left hip pain; known left hip fracture.  LEFT HIP - COMPLETE 2+ VIEW  Comparison: None.  Findings: There is a markedly displaced transcervical fracture involving the left femoral neck, with well more than one shaft width superior displacement of the distal femur.  Surrounding heterotopic bone formation suggests that this fracture is subacute or chronic.  The left femoral head remains seated at the acetabulum.  No additional fractures are seen.  The right hip joint is grossly unremarkable in appearance.  Mild sclerotic change is seen at the sacroiliac joints.  The visualized bowel gas pattern is grossly unremarkable.  IMPRESSION: Markedly displaced transcervical fracture involving the left femoral neck, with well more than one shaft width superior displacement of the distal femur.  Surrounding  heterotopic bone formation suggests that this fracture is subacute or chronic.   Original Report Authenticated By: Tonia Ghent, M.D.     Review of Systems  Constitutional: Negative.   HENT: Negative.   Eyes: Negative.   Respiratory: Negative.   Cardiovascular: Negative.   Gastrointestinal: Negative.   Genitourinary: Negative.   Musculoskeletal: Negative.   Skin: Negative.   Neurological: Negative.   Endo/Heme/Allergies: Negative.   Psychiatric/Behavioral: Negative.     Blood pressure 131/82, pulse 101, temperature 98.9 F (37.2 C), resp. rate 18, SpO2 95.00%. Physical Exam  Constitutional: She appears well-developed.       Moderately nourished.  HENT:  Head: Normocephalic and atraumatic.  Right Ear: External ear normal.  Left Ear: External ear normal.  Eyes: Conjunctivae normal are normal. Pupils are equal, round, and reactive to light. Right eye exhibits no discharge. Left eye exhibits no discharge. No scleral icterus.  Neck: Normal range of motion. Neck supple.  Cardiovascular: Normal rate and regular rhythm.   Respiratory: Effort normal and breath sounds normal. No respiratory distress. She has no wheezes. She has no rales.  GI: Soft. Bowel sounds are normal. She exhibits no distension. There is no tenderness. There is no rebound.  Musculoskeletal: She exhibits no edema.       Patient has kept her legs flexed and difficult to further assess.  Neurological: She is alert.       Not oriented to name place or person. Does not follow commands.  Skin: Skin is warm and dry.     Assessment/Plan #1. Left femur neck fracture - patient is time will be kept n.p.o. in anticipation of possible surgery. Pain relief medications. #2. History of asthma presently not wheezing - continue inhalers. #3. Dementia - continue home medications.  Patient has had problems with having taken Haldol and should be tried to avoid it.  CODE STATUS - presently full code. Have discussed with patient's  husband Mr. Kiara Gilbert who is the health care power of attorney and can be reached  at [phone].  Kiara Gilbert N. 04/09/2012, 12:27 AM

## 2012-04-09 NOTE — Progress Notes (Signed)
Utilization review completed. Ismail Graziani, RN, BSN. 

## 2012-04-09 NOTE — Consult Note (Addendum)
Default, Provider, MD Chief Complaint: Hip fx History: 67 year-old female who lives in [place] dementia facility was brought today after x-rays over there revealed left hip fracture. Patient has patient's husband has not been walking as usual and x-rays were done today which revealed the fracture. Patient has had multiple falls previously the last one was on December 6. At this time Dr. Shon Baton of orthopedics was consulted by ER physician and patient will be admitted for further management for possible surgery.  Past Medical History  Diagnosis Date  . Dementia   . Asthma   . Arthritis     No Known Allergies  No current facility-administered medications on file prior to encounter.   Current Outpatient Prescriptions on File Prior to Encounter  Medication Sig Dispense Refill  . albuterol (PROVENTIL HFA;VENTOLIN HFA) 108 (90 BASE) MCG/ACT inhaler Inhale 2 puffs into the lungs every 4 (four) hours as needed. For shortness of breath      . ALPRAZolam (XANAX) 0.25 MG tablet Take 0.5 mg by mouth at bedtime.       . donepezil (ARICEPT) 10 MG tablet Take 10 mg by mouth at bedtime.      . Folic Acid-B2-B6-B12-D-Ca-Phos (FOLGARD) TABS Take 1 tablet by mouth daily.      Marland Kitchen lactobacillus acidophilus & bulgar (LACTINEX) chewable tablet Chew 4 tablets by mouth 3 (three) times daily with meals.      . montelukast (SINGULAIR) 10 MG tablet Take 10 mg by mouth at bedtime.      . potassium chloride SA (K-DUR,KLOR-CON) 20 MEQ tablet Take 20 mEq by mouth every morning.      . sertraline (ZOLOFT) 100 MG tablet Take 100 mg by mouth every morning.         Physical Exam: Filed Vitals:   04/09/12 0602  BP: 124/59  Pulse: 84  Temp: 98.2 F (36.8 C)  Resp: 16  demented elderly woman No sob/cp abd soft/nt Indicates pain with palpation or gentle PROM of the left hip  Grossly NVI  Compartments soft/nt  1+ DP/PT pulses No other extremity issues  Image: Dg Hip Complete Left  04/08/2012  *RADIOLOGY  REPORT*  Clinical Data: Left hip pain; known left hip fracture.  LEFT HIP - COMPLETE 2+ VIEW  Comparison: None.  Findings: There is a markedly displaced transcervical fracture involving the left femoral neck, with well more than one shaft width superior displacement of the distal femur.  Surrounding heterotopic bone formation suggests that this fracture is subacute or chronic.  The left femoral head remains seated at the acetabulum.  No additional fractures are seen.  The right hip joint is grossly unremarkable in appearance.  Mild sclerotic change is seen at the sacroiliac joints.  The visualized bowel gas pattern is grossly unremarkable.  IMPRESSION: Markedly displaced transcervical fracture involving the left femoral neck, with well more than one shaft width superior displacement of the distal femur.  Surrounding heterotopic bone formation suggests that this fracture is subacute or chronic.   Original Report Authenticated By: Tonia Ghent, M.D.     A/P:  Elderly woman with dementia who has not been ambulating for at least the past month.  Brought into ER after xrays at outside facility demonstrated displaced femoral neck fx. Plan:  admit to medicine to determine operative risk  Will require hemi-arthroplasty  Will discuss with my partner   Continue care  Further recommendations pending

## 2012-04-09 NOTE — Progress Notes (Signed)
[facility] PROGRESS NOTE  Kiara Gilbert ZOX:[id number] DOB: Jun 05, 1945 DOA: 04/08/2012 PCP: Default, Provider, MD  Assessment/Plan: 1. Left femur neck fracture - patient is time will be kept n.p.o. in anticipation of possible surgery. Pain relief medications. Ortho consulted.   2. History of asthma presently not wheezing - continue inhalers.   3. Dementia - continue home medications.   Code Status: Full Family Communication: husband, bedside Disposition Plan: pending ortho plan   Consultants:  Ortho  Procedures:  none  Antibiotics:  none  HPI/Subjective: NAD this morning, pleasantly demented   Objective: Filed Vitals:   04/08/12 2148 04/08/12 2353 04/09/12 0037 04/09/12 0602  BP: 127/65 131/82 106/46 124/59  Pulse: 84 101 100 84  Temp:   98.4 F (36.9 C) 98.2 F (36.8 C)  TempSrc:   Axillary   Resp: 18 18 16 16   Height:   5\' 2"  (1.575 m)   Weight:   52.753 kg (116 lb 4.8 oz)   SpO2: 97% 95% 96% 98%    Intake/Output Summary (Last 24 hours) at 04/09/12 1240 Last data filed at 04/09/12 0500  Gross per 24 hour  Intake    300 ml  Output    300 ml  Net      0 ml   Filed Weights   04/09/12 0037  Weight: 52.753 kg (116 lb 4.8 oz)    Exam:   General:  NAD  Cardiovascular: RRR, no murmurs  Respiratory: CTA biL  Abdomen: soft, NTTP  MSK: no edema, legs flexed  Data Reviewed: Basic Metabolic Panel:  Lab 04/09/12 8119 04/08/12 2149  NA 141 140  K 3.8 3.7  CL 104 103  CO2 26 26  GLUCOSE 107* 128*  BUN 15 18  CREATININE 0.52 0.67  CALCIUM 8.8 9.2  MG -- --  PHOS -- --   CBC:  Lab 04/09/12 0551 04/08/12 2149  WBC 6.4 8.8  NEUTROABS -- 5.7  HGB 10.9* 11.8*  HCT 34.1* 35.8*  MCV 91.4 91.3  PLT 274 301   CBG:  Lab 04/09/12 0606  GLUCAP 101*    Recent Results (from the past 240 hour(s))  MRSA PCR SCREENING     Status: Normal   Collection Time   04/09/12  2:29 AM      Component Value Range Status Comment   MRSA by PCR  NEGATIVE  NEGATIVE Final      Studies: Dg Hip Complete Left  04/08/2012  *RADIOLOGY REPORT*  Clinical Data: Left hip pain; known left hip fracture.  LEFT HIP - COMPLETE 2+ VIEW  Comparison: None.  Findings: There is a markedly displaced transcervical fracture involving the left femoral neck, with well more than one shaft width superior displacement of the distal femur.  Surrounding heterotopic bone formation suggests that this fracture is subacute or chronic.  The left femoral head remains seated at the acetabulum.  No additional fractures are seen.  The right hip joint is grossly unremarkable in appearance.  Mild sclerotic change is seen at the sacroiliac joints.  The visualized bowel gas pattern is grossly unremarkable.  IMPRESSION: Markedly displaced transcervical fracture involving the left femoral neck, with well more than one shaft width superior displacement of the distal femur.  Surrounding heterotopic bone formation suggests that this fracture is subacute or chronic.   Original Report Authenticated By: Tonia Ghent, M.D.     Scheduled Meds:   . sodium chloride   Intravenous STAT  . ALPRAZolam  0.5 mg Oral QHS  . donepezil  10 mg Oral QHS  . lactobacillus acidophilus & bulgar  4 tablet Oral TID WC  . montelukast  10 mg Oral QHS  . polyethylene glycol  17 g Oral Daily  . potassium chloride SA  20 mEq Oral q morning - 10a  . sertraline  100 mg Oral q morning - 10a   Continuous Infusions:   . dextrose 5 % and 0.9% NaCl      Principal Problem:  *Left displaced femoral neck fracture Active Problems:  Asthma  Dementia  Kiara Gilbert  [facility] Pager [phone]. If 8PM-8AM, please contact night-coverage at [email or web address], password [facility] 04/09/2012, 12:40 PM  LOS: 1 day

## 2012-04-10 ENCOUNTER — Inpatient Hospital Stay (HOSPITAL_COMMUNITY): Payer: Medicare Other

## 2012-04-10 LAB — CBC
HCT: 33.2 % — ABNORMAL LOW (ref 36.0–46.0)
MCHC: 32.5 g/dL (ref 30.0–36.0)
Platelets: 272 10*3/uL (ref 150–400)
RDW: 13.6 % (ref 11.5–15.5)
WBC: 10.1 10*3/uL (ref 4.0–10.5)

## 2012-04-10 LAB — BASIC METABOLIC PANEL
BUN: 8 mg/dL (ref 6–23)
Creatinine, Ser: 0.49 mg/dL — ABNORMAL LOW (ref 0.50–1.10)
GFR calc Af Amer: >90 mL/min (ref >90)
GFR calc non Af Amer: >90 mL/min (ref >90)
Potassium: 3.8 mEq/L (ref 3.5–5.1)

## 2012-04-10 LAB — GLUCOSE, CAPILLARY
Glucose-Capillary: 115 mg/dL — ABNORMAL HIGH (ref 70–99)
Glucose-Capillary: 137 mg/dL — ABNORMAL HIGH (ref 70–99)

## 2012-04-10 MED ORDER — PHENOL 1.4 % MT LIQD: 1.0000 | OROMUCOSAL | Status: DC | PRN

## 2012-04-10 MED ORDER — METOCLOPRAMIDE HCL 5 MG/ML IJ SOLN: 5.0000 mg | Freq: Three times a day (TID) | INTRAMUSCULAR | Status: DC | PRN

## 2012-04-10 MED ORDER — CEFAZOLIN SODIUM-DEXTROSE 2-3 GM-% IV SOLR
2.0000 g | Freq: Four times a day (QID) | INTRAVENOUS | Status: AC
  Administered 2012-04-10 (×2): 2 g via INTRAVENOUS
  Filled 2012-04-10 (×2): qty 50

## 2012-04-10 MED ORDER — ENSURE COMPLETE PO LIQD: 237.0000 mL | Freq: Two times a day (BID) | ORAL | Status: DC

## 2012-04-10 MED ORDER — PROMETHAZINE HCL 25 MG/ML IJ SOLN: 6.2500 mg | INTRAMUSCULAR | Status: DC | PRN

## 2012-04-10 MED ORDER — MENTHOL 3 MG MT LOZG: 1.0000 | LOZENGE | OROMUCOSAL | Status: DC | PRN

## 2012-04-10 MED ORDER — HYDROCODONE-ACETAMINOPHEN 5-325 MG PO TABS
1.0000 | ORAL_TABLET | Freq: Four times a day (QID) | ORAL | Status: DC | PRN
  Administered 2012-04-10: 2 via ORAL
  Filled 2012-04-10: qty 2
  Filled 2012-04-10: qty 1

## 2012-04-10 MED ORDER — HYDROMORPHONE HCL PF 1 MG/ML IJ SOLN
0.5000 mg | INTRAMUSCULAR | Status: DC | PRN
  Administered 2012-04-10 – 2012-04-11 (×2): 1 mg via INTRAVENOUS
  Filled 2012-04-10 (×2): qty 1

## 2012-04-10 MED ORDER — HYDROCODONE-ACETAMINOPHEN 5-325 MG PO TABS: 1.0000 | ORAL_TABLET | Freq: Four times a day (QID) | ORAL | Status: AC | PRN

## 2012-04-10 MED ORDER — ENOXAPARIN SODIUM 40 MG/0.4ML ~~LOC~~ SOLN
40.0000 mg | SUBCUTANEOUS | Status: DC
  Administered 2012-04-10 – 2012-04-11 (×2): 40 mg via SUBCUTANEOUS
  Filled 2012-04-10 (×2): qty 0.4

## 2012-04-10 MED ORDER — ENSURE COMPLETE PO LIQD
237.0000 mL | Freq: Three times a day (TID) | ORAL | Status: DC
  Administered 2012-04-11 (×2): 237 mL via ORAL

## 2012-04-10 MED ORDER — FERROUS SULFATE 325 (65 FE) MG PO TABS
325.0000 mg | ORAL_TABLET | Freq: Three times a day (TID) | ORAL | Status: DC
  Administered 2012-04-10 – 2012-04-11 (×3): 325 mg via ORAL
  Filled 2012-04-10 (×7): qty 1

## 2012-04-10 MED ORDER — METOCLOPRAMIDE HCL 10 MG PO TABS: 5.0000 mg | ORAL_TABLET | Freq: Three times a day (TID) | ORAL | Status: DC | PRN

## 2012-04-10 MED ORDER — HYDROCODONE-ACETAMINOPHEN 5-325 MG PO TABS
1.0000 | ORAL_TABLET | ORAL | Status: DC | PRN
  Administered 2012-04-11: 1 via ORAL
  Filled 2012-04-10: qty 1

## 2012-04-10 MED ORDER — HYDROMORPHONE HCL PF 1 MG/ML IJ SOLN
0.2500 mg | INTRAMUSCULAR | Status: DC | PRN
  Administered 2012-04-10 (×2): 0.5 mg via INTRAVENOUS

## 2012-04-10 MED ORDER — ENOXAPARIN SODIUM 40 MG/0.4ML ~~LOC~~ SOLN: 40.0000 mg | SUBCUTANEOUS | Status: AC

## 2012-04-10 NOTE — Evaluation (Addendum)
Physical Therapy Evaluation Patient Details Name: Kiara Gilbert MRN: [id number] DOB: December 31, 1945 Today's Date: 04/10/2012 Time: 1140-1208 PT Time Calculation (min): 28 min  PT Assessment / Plan / Recommendation Clinical Impression  pt presents with L hip fx s/p hemi hip.  pt with cognitive deficits at baseline and cognition limits participation.  pt's husband notes pt has been very immobile for past 60-90 days and states that even prior to that pt has had mobility deficits due to anti-psychotic meds.  pt will need total care at D/C if [facility] can continue to provide this level of care.      PT Assessment  Patient needs continued PT services    Follow Up Recommendations  Supervision/Assistance - 24 hour (ALF that can provide increased level of care.)    Does the patient have the potential to tolerate intense rehabilitation      Barriers to Discharge None      Equipment Recommendations  Rolling walker with 5" wheels    Recommendations for Other Services     Frequency Min 3X/week    Precautions / Restrictions Precautions Precautions: Fall;Posterior Hip Precaution Booklet Issued: Yes (comment) Precaution Comments: keep pillow between legs Restrictions Weight Bearing Restrictions: Yes LLE Weight Bearing: Weight bearing as tolerated Other Position/Activity Restrictions: post hip   Pertinent Vitals/Pain Indicates pain, but unable to rate.        Mobility  Bed Mobility Bed Mobility: Supine to Sit;Sitting - Scoot to Edge of Bed Supine to Sit: 1: +2 Total assist;HOB elevated Supine to Sit: Patient Percentage: 10% Sitting - Scoot to Edge of Bed: 1: +2 Total assist Sitting - Scoot to Edge of Bed: Patient Percentage: 0% Details for Bed Mobility Assistance: used chuck to assist with mobility. Pt unable to initiate movement Transfers Transfers: Sit to Stand;Stand to Sit;Stand Pivot Transfers Sit to Stand: 1: +2 Total assist;From bed Sit to Stand: Patient Percentage:  10% Stand to Sit: 1: +2 Total assist;To chair/3-in-1 Stand to Sit: Patient Percentage: 20% Stand Pivot Transfers: 1: +2 Total assist Stand Pivot Transfers: Patient Percentage: 20% Details for Transfer Assistance: Better performance with moving slowly. simple commans to pt.  Ambulation/Gait Ambulation/Gait Assistance: Not tested (comment) Stairs: No    Shoulder Instructions     Exercises     PT Diagnosis: Difficulty walking;Acute pain  PT Problem List: Decreased strength;Decreased range of motion;Decreased activity tolerance;Decreased balance;Decreased mobility;Decreased cognition;Decreased knowledge of use of DME;Decreased safety awareness;Decreased knowledge of precautions;Pain PT Treatment Interventions: DME instruction;Gait training;Functional mobility training;Therapeutic activities;Therapeutic exercise;Balance training;Cognitive remediation;Patient/family education   PT Goals Acute Rehab PT Goals PT Goal Formulation: With family Time For Goal Achievement: 04/24/12 Potential to Achieve Goals: Fair Pt will go Supine/Side to Sit: with min assist PT Goal: Supine/Side to Sit - Progress: Goal set today Pt will go Sit to Supine/Side: with min assist PT Goal: Sit to Supine/Side - Progress: Goal set today Pt will go Sit to Stand: with min assist PT Goal: Sit to Stand - Progress: Goal set today Pt will go Stand to Sit: with min assist PT Goal: Stand to Sit - Progress: Goal set today Pt will Transfer Bed to Chair/Chair to Bed: with min assist PT Transfer Goal: Bed to Chair/Chair to Bed - Progress: Goal set today Pt will Ambulate: 16 - 50 feet;with min assist;with rolling walker PT Goal: Ambulate - Progress: Goal set today  Visit Information  Last PT Received On: 04/10/12 Assistance Needed: +2 PT/OT Co-Evaluation/Treatment: Yes    Subjective Data  Subjective: My stomach is  grumbling.   Patient Stated Goal: None stated.     Prior Functioning  Home Living Lives With: Other  (Comment) (ALF - [facility]) Available Help at Discharge: Available 24 hours/day Prior Function Level of Independence: Needs assistance Needs Assistance: Bathing;Dressing;Feeding;Grooming;Toileting;Meal Prep;Light Housekeeping;Gait;Transfers Bath: Total Dressing: Total Feeding: Moderate Grooming: Maximal Toileting: Total Meal Prep: Total Light Housekeeping: Total Gait Assistance: nonambulatory x ?60 days. mostly bed - w/c. Transfer Assistance: max A Able to Take Stairs?: No Driving: No Vocation: Retired Musician: No difficulties Dominant Hand: Right    Cognition  Overall Cognitive Status: History of cognitive impairments - at baseline Arousal/Alertness: Awake/alert Orientation Level: Disoriented to;Place;Time;Situation Behavior During Session: Restless Cognition - Other Comments: agitated at times    Extremity/Trunk Assessment Right Upper Extremity Assessment RUE ROM/Strength/Tone: [facility] for tasks assessed Left Upper Extremity Assessment LUE ROM/Strength/Tone: WFL for tasks assessed Right Lower Extremity Assessment RLE ROM/Strength/Tone: Deficits RLE ROM/Strength/Tone Deficits: knee flex contractures @ 30 Left Lower Extremity Assessment LLE ROM/Strength/Tone: Deficits;Due to pain;Due to precautions LLE ROM/Strength/Tone Deficits: knee flex contracture @ 30 (post hip) LLE Coordination: Deficits Trunk Assessment Trunk Assessment: Other exceptions (appears kyphotic. unable to sit EOB independently)   Balance Balance Balance Assessed: Yes Static Sitting Balance Static Sitting - Balance Support: Bilateral upper extremity supported;Feet supported Static Sitting - Level of Assistance: 2: Max assist;Other (comment) (posterior lean)  End of Session PT - End of Session Equipment Utilized During Treatment: Gait belt Activity Tolerance: Patient limited by pain (Limited by confusion.  ) Patient left: in chair;with call bell/phone within reach;with  family/visitor present Nurse Communication: Mobility status  GP     Sunny Schlein, [place] [phone] 04/10/2012, 1:10 PM

## 2012-04-10 NOTE — Progress Notes (Addendum)
   Subjective: 1 Day Post-Op Procedure(s) (LRB): ARTHROPLASTY BIPOLAR HIP (Left)   Patient has dementia and is struggling with the physical therapists this morning when seen. She appears to express frustration in having to do things, not expressing pain from the hip. No events throughout the night.   Objective:   VITALS:   Filed Vitals:   04/10/12 0616  BP: 120/65  Pulse: 86  Temp: 97.6 F (36.4 C)  Resp: 18    Dorsiflexion/Plantar flexion intact Incision: dressing C/D/I No cellulitis present Compartment soft  LABS  Basename 04/10/12 0655 04/09/12 0551 04/08/12 2149  HGB 10.8* 10.9* 11.8*  HCT 33.2* 34.1* 35.8*  WBC 10.1 6.4 8.8  PLT 272 274 301     Basename 04/10/12 0655 04/09/12 0551 04/08/12 2149  NA 139 141 140  K 3.8 3.8 3.7  BUN 8 15 18   CREATININE 0.49* 0.52 0.67  GLUCOSE 121* 107* 128*     Assessment/Plan: 1 Day Post-Op Procedure(s) (LRB): ARTHROPLASTY BIPOLAR HIP (Left) Up with therapy Orthopaedically she is stable. She is to be WBAT on the left hip, as she wouldn't be compliant otherwise. She is to have Lovenox for 14 days total, Rx on chart Norco for pain management, Rx on chart. Dressing can stay on for 2 day and the replaced with guaze and tape. Keep the area clean and dry until follow up. Follow up in 2 weeks at [facility]. Follow up with OLIN,[facility]s [place] D in 2 weeks.  Contact information:  [facility] [address] [place] [address] [phone]         Anastasio Auerbach. Sherise Geerdes   PAC  04/10/2012, 12:13 PM

## 2012-04-10 NOTE — Transfer of Care (Signed)
Immediate Anesthesia Transfer of Care Note  Patient: Kiara Gilbert  Procedure(s) Performed: Procedure(s) (LRB) with comments: ARTHROPLASTY BIPOLAR HIP (Left)  Patient Location: PACU  Anesthesia Type:General  Level of Consciousness: sedated  Airway & Oxygen Therapy: Patient Spontanous Breathing and Patient connected to nasal cannula oxygen  Post-op Assessment: Report given to PACU RN and Post -op Vital signs reviewed and stable  Post vital signs: Reviewed and stable  Complications: No apparent anesthesia complications

## 2012-04-10 NOTE — Progress Notes (Signed)
[facility] PROGRESS NOTE  Kiara Gilbert ZOX:[id number] DOB: 1945/05/21 DOA: 04/08/2012 PCP: Default, Provider, MD  Assessment/Plan: 1. Left femur neck fracture -Ortho consulted. S/p arthroplasty yesterday. Pain control. PT eval.   2. History of asthma presently not wheezing - continue inhalers.   3. Dementia - continue home medications.   Code Status: Full Family Communication: husband, bedside Disposition Plan: pending , possibly tomorrow   Consultants:  Ortho  Procedures:   ARTHROPLASTY BIPOLAR HIP (Left)  Antibiotics:  none  HPI/Subjective: NAD this morning, pleasantly demented   Objective: Filed Vitals:   04/10/12 0120 04/10/12 0443 04/10/12 0616 04/10/12 1400  BP: 97/57 110/64 120/65 111/54  Pulse: 102  86 101  Temp: 97.4 F (36.3 C)  97.6 F (36.4 C) 99.4 F (37.4 C)  TempSrc:      Resp: 18  18 18   Height:      Weight:      SpO2: 98%  99% 96%    Intake/Output Summary (Last 24 hours) at 04/10/12 1452 Last data filed at 04/10/12 1200  Gross per 24 hour  Intake   1420 ml  Output    850 ml  Net    570 ml   Filed Weights   04/09/12 0037  Weight: 52.753 kg (116 lb 4.8 oz)    Exam:   General:  NAD  Cardiovascular: RRR, no murmurs  Respiratory: CTA biL  Abdomen: soft, NTTP  MSK: no edema, legs flexed  Data Reviewed: Basic Metabolic Panel:  Lab 04/10/12 8119 04/09/12 0551 04/08/12 2149  NA 139 141 140  K 3.8 3.8 3.7  CL 103 104 103  CO2 24 26 26   GLUCOSE 121* 107* 128*  BUN 8 15 18   CREATININE 0.49* 0.52 0.67  CALCIUM 8.7 8.8 9.2  MG -- -- --  PHOS -- -- --   CBC:  Lab 04/10/12 0655 04/09/12 0551 04/08/12 2149  WBC 10.1 6.4 8.8  NEUTROABS -- -- 5.7  HGB 10.8* 10.9* 11.8*  HCT 33.2* 34.1* 35.8*  MCV 90.5 91.4 91.3  PLT 272 274 301   CBG:  Lab 04/10/12 1159 04/09/12 1649 04/09/12 1141 04/09/12 1133 04/09/12 0606  GLUCAP 115* 85 98 153* 101*    Recent Results (from the past 240 hour(s))  MRSA PCR SCREENING      Status: Normal   Collection Time   04/09/12  2:29 AM      Component Value Range Status Comment   MRSA by PCR NEGATIVE  NEGATIVE Final      Studies: Dg Hip Complete Left  04/08/2012  *RADIOLOGY REPORT*  Clinical Data: Left hip pain; known left hip fracture.  LEFT HIP - COMPLETE 2+ VIEW  Comparison: None.  Findings: There is a markedly displaced transcervical fracture involving the left femoral neck, with well more than one shaft width superior displacement of the distal femur.  Surrounding heterotopic bone formation suggests that this fracture is subacute or chronic.  The left femoral head remains seated at the acetabulum.  No additional fractures are seen.  The right hip joint is grossly unremarkable in appearance.  Mild sclerotic change is seen at the sacroiliac joints.  The visualized bowel gas pattern is grossly unremarkable.  IMPRESSION: Markedly displaced transcervical fracture involving the left femoral neck, with well more than one shaft width superior displacement of the distal femur.  Surrounding heterotopic bone formation suggests that this fracture is subacute or chronic.   Original Report Authenticated By: Tonia Ghent, M.D.    Dg Pelvis Portable  04/10/2012  *RADIOLOGY REPORT*  Clinical Data: Postop left hip replacement.  PORTABLE PELVIS  Comparison: 04/08/2012  Findings: Single view of the pelvis demonstrates a left hip hemiarthroplasty.  The very distal aspect of the femoral stem is not visualized.  The left hip appears located on this single view. Pelvic bony ring is intact.  Expected postoperative changes in the soft tissues.  IMPRESSION: Left hip hemiarthroplasty.   Original Report Authenticated By: Richarda Overlie, M.D.     Scheduled Meds:    . ALPRAZolam  0.5 mg Oral QHS  . donepezil  10 mg Oral QHS  . enoxaparin (LOVENOX) injection  40 mg Subcutaneous Q24H  . ferrous sulfate  325 mg Oral TID PC  . lactobacillus acidophilus & bulgar  4 tablet Oral TID WC  . montelukast  10 mg  Oral QHS  . polyethylene glycol  17 g Oral Daily  . potassium chloride SA  20 mEq Oral q morning - 10a  . sertraline  100 mg Oral q morning - 10a   Continuous Infusions:    . dextrose 5 % and 0.9% NaCl      Principal Problem:  *Left displaced femoral neck fracture Active Problems:  Asthma  Dementia  Kiara Gilbert  [facility] Pager [phone]. If 8PM-8AM, please contact night-coverage at [email or web address], password [facility] 04/10/2012, 2:52 PM  LOS: 2 days

## 2012-04-10 NOTE — Progress Notes (Signed)
Occupational Therapy Evaluation Patient Details Name: Kiara Gilbert MRN: [id number] DOB: 08/08/1945 Today's Date: 04/10/2012 Time: 7829-5621 OT Time Calculation (min): 28 min  OT Assessment / Plan / Recommendation Clinical Impression  67 yo with dementia who is a resident of [facility]. Pt with decline in mobility over past month. XRAY revealed L hip fx. Underwent L post hemiarthroplasty. WBAT. Discussed D/C plan with husband. If [facility] can provide nec level of care, feel that is most appropriate D/C plan as pt is familiar with that environment. Per husband's discussion with facility, they are agreeable to care for pt. This needs to be confirmed. Pt ready to D/C when medically stable.    OT Assessment  All further OT needs can be met in the next venue of care    Follow Up Recommendations  Other (comment);Supervision/Assistance - 24 hour (ALF )    Barriers to Discharge  none    Equipment Recommendations  None recommended by OT    Recommendations for Other Services  SW for assist with placement  Frequency    eval only   Precautions / Restrictions Precautions Precautions: Fall;Posterior Hip Precaution Comments: keep pillow between legs Restrictions Weight Bearing Restrictions: Yes LLE Weight Bearing: Weight bearing as tolerated Other Position/Activity Restrictions: post hip   Pertinent Vitals/Pain Vitals WNL.     ADL  Eating/Feeding: Moderate assistance (Max to encourage PO intake) Where Assessed - Eating/Feeding: Chair Grooming: Maximal assistance Where Assessed - Grooming: Supported sitting Upper Body Bathing: Maximal assistance Where Assessed - Upper Body Bathing: Supported sitting Lower Body Bathing: +1 Total assistance Where Assessed - Lower Body Bathing: Supine, head of bed up;Rolling right and/or left Upper Body Dressing: Maximal assistance Where Assessed - Upper Body Dressing: Supported sitting Lower Body Dressing: +1 Total assistance Where Assessed  - Lower Body Dressing: Supported sit to Pharmacist, hospital: +2 Total assistance Toilet Transfer: Patient Percentage: 20% Toilet Transfer Method: Other (comment) (bed - chair) Equipment Used: Gait belt Transfers/Ambulation Related to ADLs: total a +2   ADL Comments: At baseline with ADL with the exception of feeding.    OT Diagnosis: Generalized weakness;Acute pain;Cognitive deficits;Altered mental status  OT Problem List: Decreased strength;Decreased activity tolerance;Impaired balance (sitting and/or standing);Pain;Decreased knowledge of use of DME or AE;Decreased knowledge of precautions OT Treatment Interventions:     OT Goals Acute Rehab OT Goals OT Goal Formulation:  (eval only)  Visit Information  Last OT Received On: 04/10/12 Assistance Needed: +2 PT/OT Co-Evaluation/Treatment: Yes    Subjective Data      Prior Functioning     Home Living Lives With: Other (Comment) (ALF - [facility]) Available Help at Discharge: Available 24 hours/day Prior Function Level of Independence: Needs assistance Needs Assistance: Bathing;Dressing;Feeding;Grooming;Toileting;Meal Prep;Light Housekeeping;Gait;Transfers Bath: Total Dressing: Total Feeding: Moderate Grooming: Maximal Toileting: Total Meal Prep: Total Light Housekeeping: Total Gait Assistance: nonambulatory x ?60 days. mostly bed - w/c. Transfer Assistance: max A Able to Take Stairs?: No Driving: No Vocation: Retired Musician: No difficulties Dominant Hand: Right         Vision/Perception     Cognition  Overall Cognitive Status: History of cognitive impairments - at baseline Arousal/Alertness: Awake/alert Orientation Level: Disoriented to;Place;Time;Situation Behavior During Session: Restless Cognition - Other Comments: agitated at times    Extremity/Trunk Assessment Right Upper Extremity Assessment RUE ROM/Strength/Tone: [facility] for tasks assessed Left Upper Extremity  Assessment LUE ROM/Strength/Tone: WFL for tasks assessed Right Lower Extremity Assessment RLE ROM/Strength/Tone: Deficits RLE ROM/Strength/Tone Deficits: knee flex contractures @  30 Left Lower Extremity Assessment LLE ROM/Strength/Tone: Deficits;Due to pain;Due to precautions LLE ROM/Strength/Tone Deficits: knee flex contracture @ 30 (post hip) LLE Coordination: Deficits Trunk Assessment Trunk Assessment: Other exceptions (appears kyphotic. unable to sit EOB independently)     Mobility Bed Mobility Bed Mobility: Supine to Sit;Sitting - Scoot to Edge of Bed Supine to Sit: 1: +2 Total assist;HOB elevated Supine to Sit: Patient Percentage: 10% Sitting - Scoot to Edge of Bed: 1: +2 Total assist Sitting - Scoot to Edge of Bed: Patient Percentage: 0% Details for Bed Mobility Assistance: used chuck to assist with mobility. Pt unable to initiate movement Transfers Transfers: Sit to Stand;Stand to Sit Sit to Stand: 1: +2 Total assist;From bed Sit to Stand: Patient Percentage: 10% Stand to Sit: 1: +2 Total assist;To chair/3-in-1 Stand to Sit: Patient Percentage: 20% Details for Transfer Assistance: Better performance with moving slowly. simple commans to pt.      Shoulder Instructions     Exercise     Balance Balance Balance Assessed: Yes Static Sitting Balance Static Sitting - Balance Support: Bilateral upper extremity supported;Feet supported Static Sitting - Level of Assistance: 2: Max assist;Other (comment) (posterior lean)   End of Session OT - End of Session Equipment Utilized During Treatment: Gait belt Activity Tolerance: Treatment limited secondary to agitation Patient left: in chair;with call bell/phone within reach;with family/visitor present Nurse Communication: Mobility status;Weight bearing status;Precautions  GO     Kiara Gilbert,HILLARY 04/10/2012, 12:38 PM [facility], OTR/L  [phone] 04/10/2012

## 2012-04-10 NOTE — Anesthesia Postprocedure Evaluation (Signed)
  Anesthesia Post-op Note  Patient: Kiara Gilbert  Procedure(s) Performed: Procedure(s) (LRB) with comments: ARTHROPLASTY BIPOLAR HIP (Left)  Patient Location: PACU  Anesthesia Type:General  Level of Consciousness: awake  Airway and Oxygen Therapy: Patient Spontanous Breathing  Post-op Pain: mild  Post-op Assessment: Post-op Vital signs reviewed, Patient's Cardiovascular Status Stable, Respiratory Function Stable, Patent Airway, No signs of Nausea or vomiting and Pain level controlled  Post-op Vital Signs: stable  Complications: No apparent anesthesia complications

## 2012-04-11 DIAGNOSIS — E876 Hypokalemia: Secondary | ICD-10-CM | POA: Diagnosis not present

## 2012-04-11 LAB — CBC
MCH: 29.8 pg (ref 26.0–34.0)
MCV: 89.6 fL (ref 78.0–100.0)
Platelets: 254 10*3/uL (ref 150–400)
RDW: 13.7 % (ref 11.5–15.5)
WBC: 10.5 10*3/uL (ref 4.0–10.5)

## 2012-04-11 LAB — GLUCOSE, CAPILLARY
Glucose-Capillary: 116 mg/dL — ABNORMAL HIGH (ref 70–99)
Glucose-Capillary: 131 mg/dL — ABNORMAL HIGH (ref 70–99)
Glucose-Capillary: 134 mg/dL — ABNORMAL HIGH (ref 70–99)

## 2012-04-11 LAB — BASIC METABOLIC PANEL
Calcium: 8.5 mg/dL (ref 8.4–10.5)
Creatinine, Ser: 0.48 mg/dL — ABNORMAL LOW (ref 0.50–1.10)
GFR calc Af Amer: >90 mL/min (ref >90)
Sodium: 139 mEq/L (ref 135–145)

## 2012-04-11 MED ORDER — POTASSIUM CHLORIDE CRYS ER 20 MEQ PO TBCR: 40.0000 meq | EXTENDED_RELEASE_TABLET | Freq: Two times a day (BID) | ORAL | Status: AC

## 2012-04-11 MED ORDER — ENSURE COMPLETE PO LIQD: 237.0000 mL | Freq: Three times a day (TID) | ORAL | Status: AC

## 2012-04-11 MED ORDER — POTASSIUM CHLORIDE CRYS ER 20 MEQ PO TBCR
60.0000 meq | EXTENDED_RELEASE_TABLET | Freq: Once | ORAL | Status: AC
  Administered 2012-04-11: 60 meq via ORAL

## 2012-04-11 MED ORDER — POTASSIUM CHLORIDE 10 MEQ/100ML IV SOLN
10.0000 meq | INTRAVENOUS | Status: AC
  Filled 2012-04-11 (×2): qty 100

## 2012-04-11 MED ORDER — FERROUS SULFATE 325 (65 FE) MG PO TABS: 325.0000 mg | ORAL_TABLET | Freq: Three times a day (TID) | ORAL | Status: AC

## 2012-04-11 NOTE — Progress Notes (Signed)
Physical Therapy Treatment Patient Details Name: Kiara Gilbert MRN: [id number] DOB: [birth year] Today's Date: 04/11/2012 Time: 4696-2952 PT Time Calculation (min): 21 min  PT Assessment / Plan / Recommendation Comments on Treatment Session  pt presents with L hip fx s/p hemi hip.  pt difficult to keep on task and becomes fearful at times.  Will continue to follow.      Follow Up Recommendations  Supervision/Assistance - 24 hour (ALF that can provide increased level of care.)     Does the patient have the potential to tolerate intense rehabilitation     Barriers to Discharge        Equipment Recommendations  Rolling walker with 5" wheels    Recommendations for Other Services    Frequency Min 3X/week   Plan Discharge plan remains appropriate;Frequency remains appropriate    Precautions / Restrictions Precautions Precautions: Fall;Posterior Hip Precaution Booklet Issued: Yes (comment) Precaution Comments: keep pillow between legs Restrictions Weight Bearing Restrictions: Yes LLE Weight Bearing: Weight bearing as tolerated   Pertinent Vitals/Pain Indicates pain is a medium when asked.  RN made aware.      Mobility  Bed Mobility Bed Mobility: Not assessed Transfers Transfers: Sit to Stand;Stand to Sit Sit to Stand: 1: +2 Total assist;With upper extremity assist;From chair/3-in-1 Sit to Stand: Patient Percentage: 20% Stand to Sit: 1: +2 Total assist;With upper extremity assist;To chair/3-in-1;With armrests Stand to Sit: Patient Percentage: 20% Details for Transfer Assistance: pt fearful with standing today.  Attmpted to stand x3 with pt trying to push RW away and extend Bil knees straight out in front over her.   Ambulation/Gait Ambulation/Gait Assistance: Not tested (comment) Stairs: No Wheelchair Mobility Wheelchair Mobility: No    Exercises     PT Diagnosis:    PT Problem List:   PT Treatment Interventions:     PT Goals Acute Rehab PT Goals Time For Goal  Achievement: 04/24/12 Potential to Achieve Goals: Fair PT Goal: Sit to Stand - Progress: Progressing toward goal PT Goal: Stand to Sit - Progress: Progressing toward goal  Visit Information  Last PT Received On: 04/11/12 Assistance Needed: +2    Subjective Data  Subjective: We have a couple of children over there.     Cognition  Overall Cognitive Status: History of cognitive impairments - at baseline Arousal/Alertness: Awake/alert Orientation Level: Disoriented to;Place;Time;Situation Behavior During Session: Restless Cognition - Other Comments: agitated at times    Balance  Balance Balance Assessed: Yes Static Sitting Balance Static Sitting - Balance Support: Bilateral upper extremity supported;Feet supported Static Sitting - Level of Assistance: 2: Max assist  End of Session PT - End of Session Equipment Utilized During Treatment: Gait belt Activity Tolerance: Patient limited by pain (Limited by cognition.  ) Patient left: in chair;with call bell/phone within reach;with family/visitor present Nurse Communication: Mobility status   GP     Sunny Schlein,  [phone] 04/11/2012, 11:05 AM

## 2012-04-11 NOTE — Progress Notes (Signed)
Clinical Social Worker received confirmation from [place] at [facility] that they are able to accept pt with Lovenox injections as she is already set up with home health.    Pollyann Glen, MSW, [place] [phone]

## 2012-04-11 NOTE — Progress Notes (Cosign Needed)
BSW intern called and spoke with Mitchelle at [facility] about pick up for pt. Mitchelle stated that the facility was waiting on pt's bed to arrive at this time. Mitchelle stated that they would send for the pt soon because they do not pick up after 5:00pm. BSW intern gave the number for the unit to [facility] and ask if she would call to let them know when they were on their way. Mitchelle stated that she would call when they where on their way.  Morton Stall BSW Intern

## 2012-04-11 NOTE — Progress Notes (Signed)
Clinical Social Worker received notification from ALF that they are unable to accept pt for dc while dc instructions list injectable medications or medications that are listed as "take 1-2 tablets"; indicating this has to be specified.  CSW left message requesting return call from MD.    Angelia Mould, MSW, LCSWA [phone]

## 2012-04-11 NOTE — Discharge Summary (Signed)
Physician Discharge Summary  Kiara Gilbert YNW:[id number] DOB: 1945-06-26 DOA: 04/08/2012  PCP: Default, Provider, MD  Admit date: 04/08/2012 Discharge date: 04/11/2012  Time spent: 38 minutes  Recommendations for Outpatient Follow-up:  1. Follow up with potassium level in am. 2. Follow upw ith ortho in 2 weeks as recommended 3. Follow up with PCP in 1 to 2 weeks.   Discharge Diagnoses:  Principal Problem:  *Left displaced femoral neck fracture Active Problems:  Asthma  Dementia   Discharge Condition: stable  Diet recommendation: regular diet  Filed Weights   04/09/12 0037  Weight: 52.753 kg (116 lb 4.8 oz)    History of present illness:   67 year-old female who lives in [place] dementia facility was brought today after x-rays over there revealed left hip fracture. Patient has patient's husband has not been walking as usual and x-rays were done today which revealed the fracture. Patient has had multiple falls previously the last one was on December 6. At this time Dr. Shon Baton of orthopedics was consulted by ER physician and patient will be admitted for further management for possible surgery  Hospital Course:   1. Left femur neck fracture -Ortho consulted. S/p arthroplasty yesterday. Pain control. PT eval recommended that she can go back to ALF with  Increased level of care. Anticoagulation and pain medications ordered by orthopedics. Follow up with ortho in 2 weeks  2. History of asthma presently not wheezing - continue inhalers.  3. Dementia - continue home medications. 4. Hypokalemia: repleted. Follow up with a potassium level in am.   Procedures: ARTHROPLASTY BIPOLAR HIP (Left)  Consultations:  orthopedics  Discharge Exam: Filed Vitals:   04/10/12 2218 04/11/12 0000 04/11/12 0400 04/11/12 0659  BP: 128/59   130/62  Pulse: 84   80  Temp: 99.5 F (37.5 C)   97.6 F (36.4 C)  TempSrc:      Resp: 20 18 18 20   Height:      Weight:      SpO2: 90% 91% 93%  99%    General: NAD  Cardiovascular: RRR, no murmurs  Respiratory: CTA biL  Abdomen: soft, NTTP  MSK: no edema, legs flexed  Discharge Instructions  Discharge Orders    Future Orders Please Complete By Expires   Diet - low sodium heart healthy      Diet general      Call MD / Call 911      Comments:   If you experience chest pain or shortness of breath, CALL 911 and be transported to the hospital emergency room.  If you develope a fever above 101 F, pus (white drainage) or increased drainage or redness at the wound, or calf pain, call your surgeon's office.   Discharge instructions      Comments:   Daily dressing changes with 4x4 gauze and tape. Keep the area dry and clean until follow up. Follow up in 2 weeks at [facility]. Call with any questions or concerns.   Constipation Prevention      Comments:   Drink plenty of fluids.  Prune juice may be helpful.  You may use a stool softener, such as Colace (over the counter) 100 mg twice a day.  Use MiraLax (over the counter) for constipation as needed.   Increase activity slowly as tolerated      Weight bearing as tolerated      Discharge instructions      Comments:   Please obtain potassium level tomorrow. Follow up with ortho as  recommended.  Follow up with PCP as recommended.   Activity as tolerated - No restrictions          Medication List     As of 04/11/2012 12:49 PM    TAKE these medications         acetaminophen 500 MG tablet   Commonly known as: TYLENOL   Take 500 mg by mouth 3 (three) times daily.      albuterol 108 (90 BASE) MCG/ACT inhaler   Commonly known as: PROVENTIL HFA;VENTOLIN HFA   Inhale 2 puffs into the lungs every 4 (four) hours as needed. For shortness of breath      ALPRAZolam 0.25 MG tablet   Commonly known as: XANAX   Take 0.5 mg by mouth at bedtime.      ALPRAZolam 0.25 MG tablet   Commonly known as: XANAX   Take 0.25 mg by mouth 3 (three) times daily as needed. For agitation        Cranberry 425 MG Caps   Take 1 capsule by mouth daily.      donepezil 10 MG tablet   Commonly known as: ARICEPT   Take 10 mg by mouth at bedtime.      enoxaparin 40 MG/0.4ML injection   Commonly known as: LOVENOX   Inject 0.4 mLs (40 mg total) into the skin daily.      feeding supplement Liqd   Take 237 mLs by mouth 3 (three) times daily with meals.      ferrous sulfate 325 (65 FE) MG tablet   Take 1 tablet (325 mg total) by mouth 3 (three) times daily after meals.      FOLGARD Tabs   Take 1 tablet by mouth daily.      HYDROcodone-acetaminophen 5-325 MG per tablet   Commonly known as: NORCO/VICODIN   Take 1-2 tablets by mouth every 6 (six) hours as needed for pain.      lactobacillus acidophilus & bulgar chewable tablet   Chew 4 tablets by mouth 3 (three) times daily with meals.      montelukast 10 MG tablet   Commonly known as: SINGULAIR   Take 10 mg by mouth at bedtime.      polyethylene glycol packet   Commonly known as: MIRALAX / GLYCOLAX   Take 17 g by mouth daily.      potassium chloride SA 20 MEQ tablet   Commonly known as: K-DUR,KLOR-CON   Take 20 mEq by mouth every morning.      sertraline 100 MG tablet   Commonly known as: ZOLOFT   Take 100 mg by mouth every morning.           Follow-up Information    Follow up with Shelda Pal, MD. Schedule an appointment as soon as possible for a visit in 2 weeks.   Contact information:   [address] [place] [address] [phone]           The results of significant diagnostics from this hospitalization (including imaging, microbiology, ancillary and laboratory) are listed below for reference.    Significant Diagnostic Studies: Dg Hip Complete Left  04/08/2012  *RADIOLOGY REPORT*  Clinical Data: Left hip pain; known left hip fracture.  LEFT HIP - COMPLETE 2+ VIEW  Comparison: None.  Findings: There is a markedly displaced transcervical fracture involving the left femoral neck,  with well more than one shaft width superior displacement of the distal femur.  Surrounding heterotopic bone formation suggests that this fracture is  subacute or chronic.  The left femoral head remains seated at the acetabulum.  No additional fractures are seen.  The right hip joint is grossly unremarkable in appearance.  Mild sclerotic change is seen at the sacroiliac joints.  The visualized bowel gas pattern is grossly unremarkable.  IMPRESSION: Markedly displaced transcervical fracture involving the left femoral neck, with well more than one shaft width superior displacement of the distal femur.  Surrounding heterotopic bone formation suggests that this fracture is subacute or chronic.   Original Report Authenticated By: Tonia Ghent, M.D.    Dg Pelvis Portable  04/10/2012  *RADIOLOGY REPORT*  Clinical Data: Postop left hip replacement.  PORTABLE PELVIS  Comparison: 04/08/2012  Findings: Single view of the pelvis demonstrates a left hip hemiarthroplasty.  The very distal aspect of the femoral stem is not visualized.  The left hip appears located on this single view. Pelvic bony ring is intact.  Expected postoperative changes in the soft tissues.  IMPRESSION: Left hip hemiarthroplasty.   Original Report Authenticated By: Richarda Overlie, M.D.     Microbiology: Recent Results (from the past 240 hour(s))  MRSA PCR SCREENING     Status: Normal   Collection Time   04/09/12  2:29 AM      Component Value Range Status Comment   MRSA by PCR NEGATIVE  NEGATIVE Final      Labs: Basic Metabolic Panel:  Lab 04/11/12 4540 04/10/12 0655 04/09/12 0551 04/08/12 2149  NA 139 139 141 140  K 3.0* 3.8 3.8 3.7  CL 102 103 104 103  CO2 23 24 26 26   GLUCOSE 137* 121* 107* 128*  BUN 6 8 15 18   CREATININE 0.48* 0.49* 0.52 0.67  CALCIUM 8.5 8.7 8.8 9.2  MG -- -- -- --  PHOS -- -- -- --   Liver Function Tests: No results found for this basename: AST:5,ALT:5,ALKPHOS:5,BILITOT:5,PROT:5,ALBUMIN:5 in the last 168  hours No results found for this basename: LIPASE:5,AMYLASE:5 in the last 168 hours No results found for this basename: AMMONIA:5 in the last 168 hours CBC:  Lab 04/11/12 0650 04/10/12 0655 04/09/12 0551 04/08/12 2149  WBC 10.5 10.1 6.4 8.8  NEUTROABS -- -- -- 5.7  HGB 10.0* 10.8* 10.9* 11.8*  HCT 30.1* 33.2* 34.1* 35.8*  MCV 89.6 90.5 91.4 91.3  PLT 254 272 274 301   Cardiac Enzymes: No results found for this basename: CKTOTAL:5,CKMB:5,CKMBINDEX:5,TROPONINI:5 in the last 168 hours BNP: BNP (last 3 results) No results found for this basename: PROBNP:3 in the last 8760 hours CBG:  Lab 04/11/12 1204 04/11/12 0832 04/11/12 0428 04/11/12 0016 04/10/12 2133  GLUCAP 151* 116* 134* 131* 137*       Signed:  Nature Kueker  [facility] 04/11/2012, 12:49 PM

## 2012-04-12 ENCOUNTER — Encounter (HOSPITAL_COMMUNITY): Payer: Self-pay | Admitting: Orthopedic Surgery

## 2014-06-07 ENCOUNTER — Emergency Department (HOSPITAL_COMMUNITY)
Admission: EM | Admit: 2014-06-07 | Discharge: 2014-06-07 | Disposition: A | Discharge status: Home / Self Care | Payer: Medicare Other | Attending: Emergency Medicine | Admitting: Emergency Medicine

## 2014-06-07 ENCOUNTER — Encounter (HOSPITAL_COMMUNITY): Payer: Self-pay | Admitting: Emergency Medicine

## 2014-06-07 DIAGNOSIS — Y9389 Activity, other specified: Secondary | ICD-10-CM | POA: Diagnosis not present

## 2014-06-07 DIAGNOSIS — W050XXA Fall from non-moving wheelchair, initial encounter: Secondary | ICD-10-CM | POA: Diagnosis not present

## 2014-06-07 DIAGNOSIS — J45909 Unspecified asthma, uncomplicated: Secondary | ICD-10-CM | POA: Diagnosis not present

## 2014-06-07 DIAGNOSIS — S0990XA Unspecified injury of head, initial encounter: Secondary | ICD-10-CM | POA: Insufficient documentation

## 2014-06-07 DIAGNOSIS — Z7901 Long term (current) use of anticoagulants: Secondary | ICD-10-CM | POA: Diagnosis not present

## 2014-06-07 DIAGNOSIS — Z79899 Other long term (current) drug therapy: Secondary | ICD-10-CM | POA: Diagnosis not present

## 2014-06-07 DIAGNOSIS — M1990 Unspecified osteoarthritis, unspecified site: Secondary | ICD-10-CM | POA: Diagnosis not present

## 2014-06-07 DIAGNOSIS — F0390 Unspecified dementia without behavioral disturbance: Secondary | ICD-10-CM | POA: Insufficient documentation

## 2014-06-07 DIAGNOSIS — W19XXXA Unspecified fall, initial encounter: Secondary | ICD-10-CM

## 2014-06-07 DIAGNOSIS — Y92128 Other place in nursing home as the place of occurrence of the external cause: Secondary | ICD-10-CM | POA: Diagnosis not present

## 2014-06-07 DIAGNOSIS — Y998 Other external cause status: Secondary | ICD-10-CM | POA: Diagnosis not present

## 2014-06-07 NOTE — ED Notes (Addendum)
C collar unable to be placed. CBG 128. VSS for EMS.

## 2014-06-07 NOTE — ED Notes (Signed)
Pt had unwitnessed fall at [facility]; staff heard a thud and came running; found her on carpeted floor no LOC at that time. Pt has progressed dementia. No complaints of pain now. No thinners.

## 2014-06-07 NOTE — ED Notes (Signed)
Pt tolerating sitting in wheelchair without issue, husband states appears at baseline. Dr. Donnald GarrePfeiffer aware and states OK for discharge. Husband comfortable transporting patient back to [facility] via POV.  [facility] [place] of care and discharge instructions.  Pt calm and in NAD.

## 2014-06-07 NOTE — ED Provider Notes (Signed)
CSN: [id number]     Arrival date & time 06/07/14  1017 History   First MD Initiated Contact with Patient 06/07/14 1027     Chief Complaint  Patient presents with  . Fall     (Consider location/radiation/quality/duration/timing/severity/associated sxs/prior Treatment) HPI The patient is severely demented. As a baseline she sits in a wheelchair and has very limited function. Her husband is with her. He reports that she was at [facility] and fell forward out of her wheelchair. He was not there to witness episode. This is as per nursing staff. They thought she landed on the left side and struck her head. She did not lose consciousness. She has not exhibited any focal areas of pain or dysfunction beyond what is her baseline. Past Medical History  Diagnosis Date  . Dementia   . Asthma   . Arthritis    Past Surgical History  Procedure Laterality Date  . Tubal ligation    . Hip arthroplasty  04/09/2012    Procedure: ARTHROPLASTY BIPOLAR HIP;  Surgeon: Shelda PalMatthew D Olin, MD;  [facility];  Service: Orthopedics;  Laterality: Left;   History reviewed. No pertinent family history. History  Substance Use Topics  . Smoking status: Never Smoker   . Smokeless tobacco: Never Used  . Alcohol Use: No   OB History    No data available     Review of Systems Patient unable to provide review of systems due to dementia.   Allergies  Review of patient's allergies indicates no known allergies.  Home Medications   Prior to Admission medications   Medication Sig Start Date End Date Taking? Authorizing Provider  acetaminophen (TYLENOL) 500 MG tablet Take 500 mg by mouth 3 (three) times daily.    Historical Provider, MD  albuterol (PROVENTIL HFA;VENTOLIN HFA) 108 (90 BASE) MCG/ACT inhaler Inhale 2 puffs into the lungs every 4 (four) hours as needed. For shortness of breath    Historical Provider, MD  ALPRAZolam (XANAX) 0.25 MG tablet Take 0.5 mg by mouth at bedtime.     Historical Provider, MD   ALPRAZolam Prudy Feeler(XANAX) 0.25 MG tablet Take 0.25 mg by mouth 3 (three) times daily as needed. For agitation    Historical Provider, MD  Cranberry 425 MG CAPS Take 1 capsule by mouth daily.    Historical Provider, MD  donepezil (ARICEPT) 10 MG tablet Take 10 mg by mouth at bedtime.    Historical Provider, MD  enoxaparin (LOVENOX) 40 MG/0.4ML injection Inject 0.4 mLs (40 mg total) into the skin daily. 04/10/12   Lanney GinsMatthew Babish, PA-C  feeding supplement (ENSURE COMPLETE) LIQD Take 237 mLs by mouth 3 (three) times daily with meals. 04/11/12   Kathlen ModyVijaya Akula, MD  ferrous sulfate 325 (65 FE) MG tablet Take 1 tablet (325 mg total) by mouth 3 (three) times daily after meals. 04/11/12   Kathlen ModyVijaya Akula, MD  Folic Acid-B2-B6-B12-D-Ca-Phos (FOLGARD) TABS Take 1 tablet by mouth daily.    Historical Provider, MD  HYDROcodone-acetaminophen (NORCO/VICODIN) 5-325 MG per tablet Take 1-2 tablets by mouth every 6 (six) hours as needed for pain. 04/10/12   Lanney GinsMatthew Babish, PA-C  lactobacillus acidophilus & bulgar (LACTINEX) chewable tablet Chew 4 tablets by mouth 3 (three) times daily with meals.    Historical Provider, MD  montelukast (SINGULAIR) 10 MG tablet Take 10 mg by mouth at bedtime.    Historical Provider, MD  polyethylene glycol (MIRALAX / GLYCOLAX) packet Take 17 g by mouth daily.    Historical Provider, MD  potassium chloride SA (  K-DUR,KLOR-CON) 20 MEQ tablet Take 2 tablets (40 mEq total) by mouth 2 (two) times daily. 04/11/12   Kathlen Mody, MD  sertraline (ZOLOFT) 100 MG tablet Take 100 mg by mouth every morning.     Historical Provider, MD   BP 128/47 mmHg  Pulse 79  Temp(Src) 97.9 F (36.6 C) (Axillary)  Resp 19  SpO2 98% Physical Exam  Constitutional:  The patient is resting quietly in the bed. She is nontoxic in appearance. She does not have acute respiratory distress.  HENT:  Head: Normocephalic and atraumatic.  Right Ear: External ear normal.  Left Ear: External ear normal.  Nose: Nose normal.   Mouth/Throat: Oropharynx is clear and moist.  Eyes: EOM are normal.  Neck: Normal range of motion.  Cardiovascular: Normal rate.   Pulmonary/Chest: Effort normal and breath sounds normal.  Abdominal: Soft. Bowel sounds are normal.  Musculoskeletal: Normal range of motion.  I am able to pick both lower extremities up off of the bed and perform some range of motion of the hip and the knee. Patient does not object to this. Compression of the iliac crests does not elicit pain.  Neurological: She is alert. No cranial nerve deficit.  The patient is mildly somnolent and then will awaken and speak in a gibberish kind of a pattern. Her husband reports this is normal for her.  Skin: Skin is warm and dry.    ED Course  Procedures (including critical care time) Labs Review Labs Reviewed - No data to display  Imaging Review No results found.   EKG Interpretation None      MDM   Final diagnoses:  Fall, initial encounter  Minor head injury, initial encounter   Objectively there is not interested to be identified. Her husband reports she took a fairly gentle fall to the floor. He reports she is at her baseline for mental status and function. At this time she will be returned to nursing home care for observation for manifestation of new or different injuries.    Arby Barrette, MD 06/07/14 1126

## 2014-06-07 NOTE — Discharge Instructions (Signed)

## 2015-12-09 ENCOUNTER — Emergency Department (HOSPITAL_COMMUNITY)
Admission: EM | Admit: 2015-12-09 | Discharge: 2015-12-09 | Disposition: A | Discharge status: Home / Self Care | Payer: Medicare Other | Attending: Emergency Medicine | Admitting: Emergency Medicine

## 2015-12-09 ENCOUNTER — Emergency Department (HOSPITAL_COMMUNITY): Payer: Medicare Other

## 2015-12-09 ENCOUNTER — Encounter (HOSPITAL_COMMUNITY): Payer: Self-pay | Admitting: Emergency Medicine

## 2015-12-09 DIAGNOSIS — R4182 Altered mental status, unspecified: Secondary | ICD-10-CM | POA: Diagnosis not present

## 2015-12-09 DIAGNOSIS — J45909 Unspecified asthma, uncomplicated: Secondary | ICD-10-CM | POA: Diagnosis not present

## 2015-12-09 DIAGNOSIS — Z96642 Presence of left artificial hip joint: Secondary | ICD-10-CM | POA: Insufficient documentation

## 2015-12-09 DIAGNOSIS — R569 Unspecified convulsions: Secondary | ICD-10-CM | POA: Diagnosis not present

## 2015-12-09 LAB — CBC WITH DIFFERENTIAL/PLATELET
Basophils Absolute: 0 10*3/uL (ref 0.0–0.1)
Basophils Relative: 0 %
EOS PCT: 0 %
Eosinophils Absolute: 0 10*3/uL (ref 0.0–0.7)
HCT: 41.9 % (ref 36.0–46.0)
Hemoglobin: 13.2 g/dL (ref 12.0–15.0)
LYMPHS ABS: 2.2 10*3/uL (ref 0.7–4.0)
LYMPHS PCT: 19 %
MCH: 29.4 pg (ref 26.0–34.0)
MCHC: 31.5 g/dL (ref 30.0–36.0)
MCV: 93.3 fL (ref 78.0–100.0)
MONO ABS: 0.7 10*3/uL (ref 0.1–1.0)
Monocytes Relative: 7 %
Neutro Abs: 8.4 10*3/uL — ABNORMAL HIGH (ref 1.7–7.7)
Neutrophils Relative %: 74 %
PLATELETS: 327 10*3/uL (ref 150–400)
RBC: 4.49 MIL/uL (ref 3.87–5.11)
RDW: 13.1 % (ref 11.5–15.5)
WBC: 11.4 10*3/uL — ABNORMAL HIGH (ref 4.0–10.5)

## 2015-12-09 LAB — TROPONIN I

## 2015-12-09 LAB — COMPREHENSIVE METABOLIC PANEL
ALBUMIN: 3.9 g/dL (ref 3.5–5.0)
ALT: 29 U/L (ref 14–54)
ANION GAP: 20 — AB (ref 5–15)
AST: 38 U/L (ref 15–41)
Alkaline Phosphatase: 115 U/L (ref 38–126)
BILIRUBIN TOTAL: 0.4 mg/dL (ref 0.3–1.2)
BUN: 11 mg/dL (ref 6–20)
CO2: 14 mmol/L — ABNORMAL LOW (ref 22–32)
Calcium: 9.6 mg/dL (ref 8.9–10.3)
Chloride: 107 mmol/L (ref 101–111)
Creatinine, Ser: 0.91 mg/dL (ref 0.44–1.00)
Glucose, Bld: 147 mg/dL — ABNORMAL HIGH (ref 65–99)
POTASSIUM: 3.3 mmol/L — AB (ref 3.5–5.1)
Sodium: 141 mmol/L (ref 135–145)
TOTAL PROTEIN: 7 g/dL (ref 6.5–8.1)

## 2015-12-09 LAB — LIPASE, BLOOD: LIPASE: 61 U/L — AB (ref 11–51)

## 2015-12-09 MED ORDER — LORAZEPAM 1 MG PO TABS: 1.0000 mg | ORAL_TABLET | Freq: Three times a day (TID) | ORAL | 0 refills | Status: DC

## 2015-12-09 MED ORDER — SODIUM CHLORIDE 0.9 % IV BOLUS (SEPSIS)
500.0000 mL | Freq: Once | INTRAVENOUS | Status: AC
  Administered 2015-12-09: 500 mL via INTRAVENOUS

## 2015-12-09 MED ORDER — LORAZEPAM 1 MG PO TABS: 1.0000 mg | ORAL_TABLET | Freq: Three times a day (TID) | ORAL | 0 refills | Status: AC

## 2015-12-09 MED ORDER — LORAZEPAM 2 MG/ML IJ SOLN
1.0000 mg | Freq: Once | INTRAMUSCULAR | Status: AC
  Administered 2015-12-09: 1 mg via INTRAVENOUS
  Filled 2015-12-09: qty 1

## 2015-12-09 NOTE — ED Notes (Signed)
Dr. Jeraldine LootsLockwood spoke with nursing staff at [facility].  Pt's son updated on plan of care.  [facility] called to transport pt back to facility

## 2015-12-09 NOTE — ED Notes (Signed)
Pt had 30-sec long tonic-clonic seizure. RN's at bedside, suctioning performed. Ativan administered.

## 2015-12-09 NOTE — ED Notes (Signed)
Pt resting quietly, continue to wait for [facility]

## 2015-12-09 NOTE — ED Triage Notes (Signed)
Pt in from [facility] with reported "seizure activity", unknown length of time by staff. Per EMS, [facility] and suddenly had "full body convulsions".No hx of seizures or recent trauma. Per husband, pt has been weaning off of various medications, one of those being Xanax. Per husband, the "jerks/tremors" have been occasional, but more defined and frequent in past few weeks. Awake, opening eyes, but no verbalization.

## 2015-12-09 NOTE — ED Provider Notes (Signed)
[facility]-EMERGENCY DEPT Provider Note   CSN: [id number] Arrival date & time: 12/09/15  1317     History   Chief Complaint Chief Complaint  Patient presents with  . Seizures  . Altered Mental Status    HPI Kiara Gilbert is a 70 y.o. female.  HPI   Patient presents with her husband who is not with her dyspnea, but relates concern for new atypical behavior, observed at the patient's nursing facility, and still currently present. The patient herself has dementia, minimal interactivity at baseline, level V caveat. Per report, over the past day the patient has had increasing tremor and/or fasciculations, with seizure-like activity. During a particularly pronounced episode the patient was lowered from her wheelchair to the floor, no trauma. No report of vomiting, diarrhea, fever, urinary changes. Patient does have a notable history of recent attempt to wean from a benzodiazepine, and previously was taking 3 times daily today has been dosing, and is currently taking once daily, daily at bedtime dosing.   Past Medical History:  Diagnosis Date  . Arthritis   . Asthma   . Dementia     Patient Active Problem List   Diagnosis Date Noted  . Hypokalemia 04/11/2012  . Left displaced femoral neck fracture (HCC) 04/09/2012  . Asthma 04/09/2012  . Dementia 04/09/2012    Past Surgical History:  Procedure Laterality Date  . HIP ARTHROPLASTY  04/09/2012   Procedure: ARTHROPLASTY BIPOLAR HIP;  Surgeon: Shelda Pal, MD;  Location: [facility] OR;  Service: Orthopedics;  Laterality: Left;  . TUBAL LIGATION      OB History    No data available       Home Medications    Prior to Admission medications   Medication Sig Start Date End Date Taking? Authorizing Provider  acetaminophen (TYLENOL) 500 MG tablet Take 500 mg by mouth 3 (three) times daily.    Historical Provider, MD  albuterol (PROVENTIL HFA;VENTOLIN HFA) 108 (90 BASE) MCG/ACT inhaler Inhale 2 puffs into the lungs every 4 (four)  hours as needed. For shortness of breath    Historical Provider, MD  ALPRAZolam (XANAX) 0.25 MG tablet Take 0.5 mg by mouth at bedtime.     Historical Provider, MD  ALPRAZolam Prudy Feeler) 0.25 MG tablet Take 0.25 mg by mouth 3 (three) times daily as needed. For agitation    Historical Provider, MD  Cranberry 425 MG CAPS Take 1 capsule by mouth daily.    Historical Provider, MD  donepezil (ARICEPT) 10 MG tablet Take 10 mg by mouth at bedtime.    Historical Provider, MD  enoxaparin (LOVENOX) 40 MG/0.4ML injection Inject 0.4 mLs (40 mg total) into the skin daily. 04/10/12   Lanney Gins, PA-C  feeding supplement (ENSURE COMPLETE) LIQD Take 237 mLs by mouth 3 (three) times daily with meals. 04/11/12   Kathlen Mody, MD  ferrous sulfate 325 (65 FE) MG tablet Take 1 tablet (325 mg total) by mouth 3 (three) times daily after meals. 04/11/12   Kathlen Mody, MD  Folic Acid-B2-B6-B12-D-Ca-Phos (FOLGARD) TABS Take 1 tablet by mouth daily.    Historical Provider, MD  HYDROcodone-acetaminophen (NORCO/VICODIN) 5-325 MG per tablet Take 1-2 tablets by mouth every 6 (six) hours as needed for pain. 04/10/12   Lanney Gins, PA-C  lactobacillus acidophilus & bulgar (LACTINEX) chewable tablet Chew 4 tablets by mouth 3 (three) times daily with meals.    Historical Provider, MD  montelukast (SINGULAIR) 10 MG tablet Take 10 mg by mouth at bedtime.    Historical Provider, MD  polyethylene glycol (MIRALAX / GLYCOLAX) packet Take 17 g by mouth daily.    Historical Provider, MD  potassium chloride SA (K-DUR,KLOR-CON) 20 MEQ tablet Take 2 tablets (40 mEq total) by mouth 2 (two) times daily. 04/11/12   Kathlen Mody, MD  sertraline (ZOLOFT) 100 MG tablet Take 100 mg by mouth every morning.     Historical Provider, MD    Family History No family history on file.  Social History Social History  Substance Use Topics  . Smoking status: Never Smoker  . Smokeless tobacco: Never Used  . Alcohol use No     Allergies   Review of  patient's allergies indicates no known allergies.   Review of Systems Review of Systems  Unable to perform ROS: Dementia     Physical Exam Updated Vital Signs Ht 5\' 3"  (1.6 m)   Wt 116 lb (52.6 kg)   SpO2 95%   BMI 20.55 kg/m   Physical Exam  Constitutional: She appears well-developed and well-nourished. She appears listless.  Noninteractive obese elderly female.  HENT:  Head: Normocephalic and atraumatic.  Eyes: Conjunctivae and EOM are normal.  Cardiovascular: Normal rate and regular rhythm.   Pulmonary/Chest: Effort normal and breath sounds normal. No stridor. No respiratory distress.  Abdominal: She exhibits no distension.  Musculoskeletal: She exhibits no edema.  Neurological: She appears listless. No cranial nerve deficit.  Occasional tremor and/or fasciculations of his hands, bilaterally, clonic motion.   Skin: Skin is warm and dry.  Psychiatric: Cognition and memory are impaired. She is noncommunicative.  Nursing note and vitals reviewed.    ED Treatments / Results  Labs (all labs ordered are listed, but only abnormal results are displayed) Labs Reviewed  COMPREHENSIVE METABOLIC PANEL - Abnormal; Notable for the following:       Result Value   Potassium 3.3 (*)    CO2 14 (*)    Glucose, Bld 147 (*)    Anion gap 20 (*)    All other components within normal limits  LIPASE, BLOOD - Abnormal; Notable for the following:    Lipase 61 (*)    All other components within normal limits  CBC WITH DIFFERENTIAL/PLATELET - Abnormal; Notable for the following:    WBC 11.4 (*)    Neutro Abs 8.4 (*)    All other components within normal limits  TROPONIN I  URINALYSIS, ROUTINE W REFLEX MICROSCOPIC (NOT AT [facility])    EKG  EKG Interpretation  Date/Time:  Thursday December 09 2015 13:25:33 EDT Ventricular Rate:  111 PR Interval:    QRS Duration: 100 QT Interval:  353 QTC Calculation: 480 R Axis:   -19 Text Interpretation:  Sinus tachycardia Multiple ventricular  premature complexes Borderline left axis deviation Low voltage, extremity and precordial leads Minimal ST depression, diffuse leads Abnormal ekg Confirmed by Gerhard Munch  MD [phone]) on 12/09/2015 1:34:58 PM       Radiology Ct Head Wo Contrast  Result Date: 12/09/2015 CLINICAL DATA:  Patient with seizure.  Tremor. EXAM: CT HEAD WITHOUT CONTRAST TECHNIQUE: Contiguous axial images were obtained from the base of the skull through the vertex without intravenous contrast. COMPARISON:  CT brain 12/31/2011 FINDINGS: Brain: Ventricles and sulci are prominent compatible with atrophy. Periventricular and subcortical white matter hypodensity compatible with chronic microvascular ischemic changes. No evidence for acute cortically based infarct, intracranial hemorrhage, mass lesion or mass-effect. Vascular: No hyperdense vessel or unexpected calcification. Skull: Normal. Negative for fracture or focal lesion. Sinuses/Orbits: Paranasal sinuses are unremarkable. Mastoid air cells are  unremarkable. Other: None. IMPRESSION: No acute intracranial process. Chronic microvascular ischemic changes and cortical atrophy. Electronically Signed   By: Annia Beltrew  Davis M.D.   On: 12/09/2015 14:20   Dg Chest Port 1 View  Result Date: 12/09/2015 CLINICAL DATA:  Persistent tremor, seizure-like activity. EXAM: PORTABLE CHEST 1 VIEW COMPARISON:  12/31/2011 FINDINGS: Low lung volumes without focal airspace disease or pulmonary edema. Heart size is within normal limits. Trachea is midline. Negative for a pneumothorax. No acute bone abnormality. IMPRESSION: Low lung volumes without acute findings. Electronically Signed   By: Richarda OverlieAdam  Henn M.D.   On: 12/09/2015 14:54    Procedures Procedures (including critical care time)  Medications Ordered in ED Medications  LORazepam (ATIVAN) injection 1 mg (1 mg Intravenous Given 12/09/15 1401)  sodium chloride 0.9 % bolus 500 mL (500 mLs Intravenous New Bag/Given 12/09/15 1425)     Initial  Impression / Assessment and Plan / ED Course  I have reviewed the triage vital signs and the nursing notes.  Pertinent labs & imaging results that were available during my care of the patient were reviewed by me and considered in my medical decision making (see chart for details).  Clinical Course    Soon after the initial evaluation the patient had a more pronounced seizure-like activity. Patient received Ativan.  3:39 PM No additional seizure activity. All results discussed the patient's husband. We discussed the possibility of benzodiazepine withdrawal, as confirmatory etiology for her seizure-like activity.  Final Clinical Impressions(s) / ED Diagnoses   Patient presents from nursing facility after an episode of seizure-like activity, no additional now with ongoing tremor, fasciculation per Husband acknowledges that the patient has had similar tremors in the past, though apically episodic, with in frequency. Patient's new ongoing tremors, and one episode of seizure, there is concern for new intracranial pathology versus infection versus metabolic condition. 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 Patient received Ativan here, had cessation of all seizure-like activity. With reassuring CT scan, labs, and the patient's noted Baseline minimal interactive status, she was returned to her nursing facility, with prescribed benzodiazepine.    Gerhard Munchobert Tiondra Fang, MD 12/09/15 1540

## 2015-12-09 NOTE — ED Notes (Signed)
Pt taken to CT.

## 2015-12-09 NOTE — Discharge Instructions (Signed)
As discussed, it is important that you take all medication as prescribed, including the newly prescribed Ativan. If you would like to wean from a medication, please be sure to discuss with your primary care physician.  Return here for concerning changes in your condition.

## 2017-05-12 IMAGING — CT CT HEAD W/O CM
3 of 4 series · 18 of 47 positions shown, 21 images · non-contrast
Comparison: CT brain 12/31/2011

CLINICAL DATA: Patient with seizure.  Tremor.

EXAM:
CT HEAD WITHOUT CONTRAST
TECHNIQUE: Contiguous axial images were obtained from the base of the skull
through the vertex without intravenous contrast.

[Series 201: head w/o, idose (1) · axial · non-contrast · 0.40mm/px · z∈[+71,+201]mm · 12 of 32 slices shown, 15 images]
[im 3/32  brain]
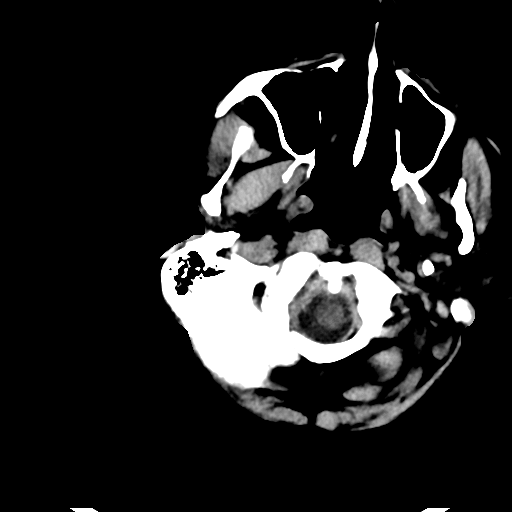
[im 3/32  bone]
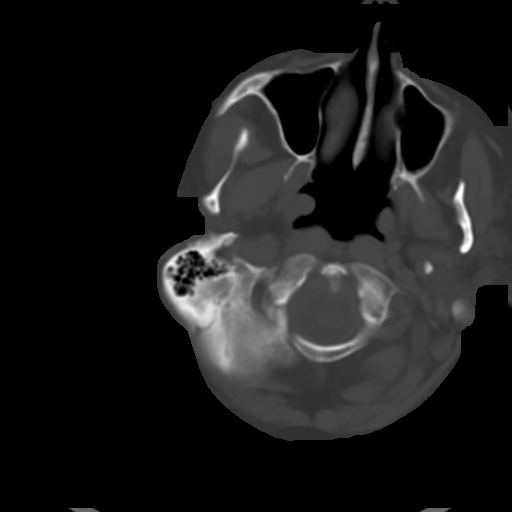
[im 5/32  brain]
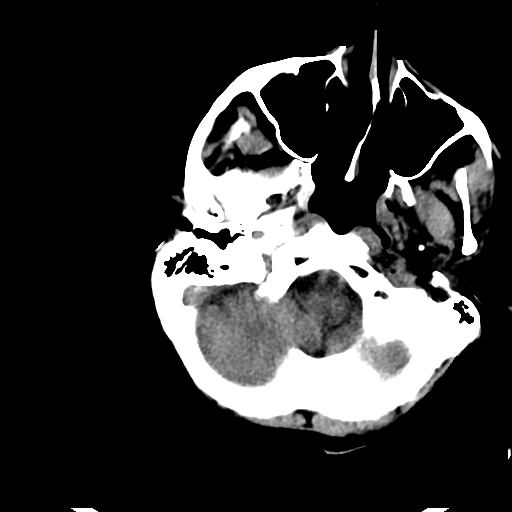
[im 7/32  brain]
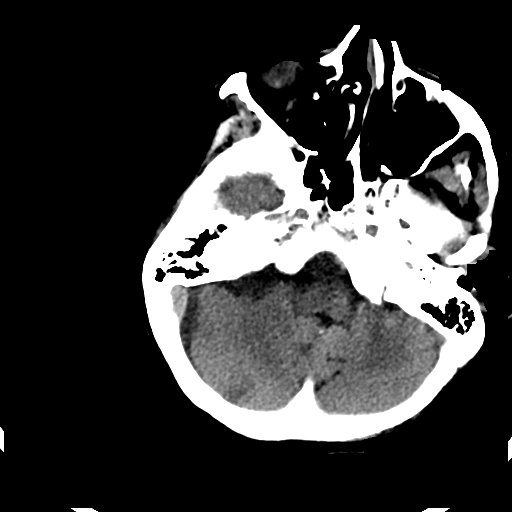
[im 9/32  brain]
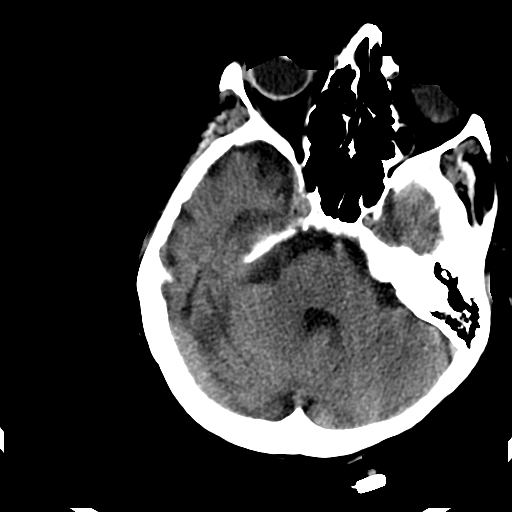
[im 12/32  brain]
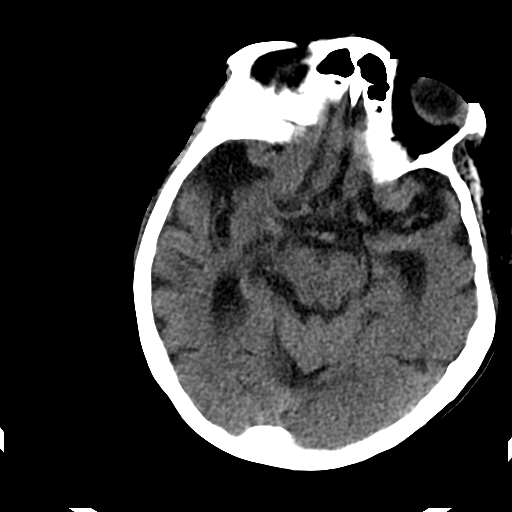
[im 12/32  bone]
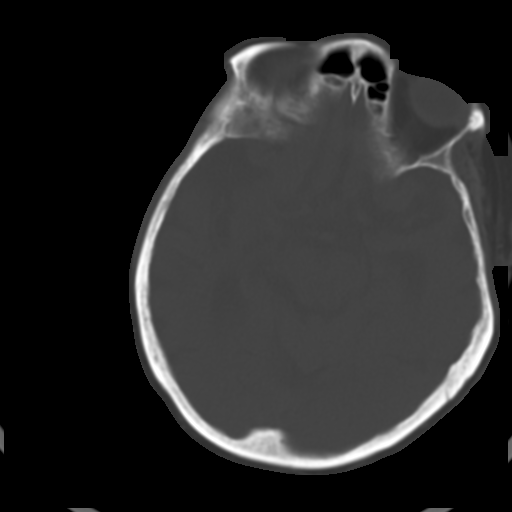
[im 14/32  brain]
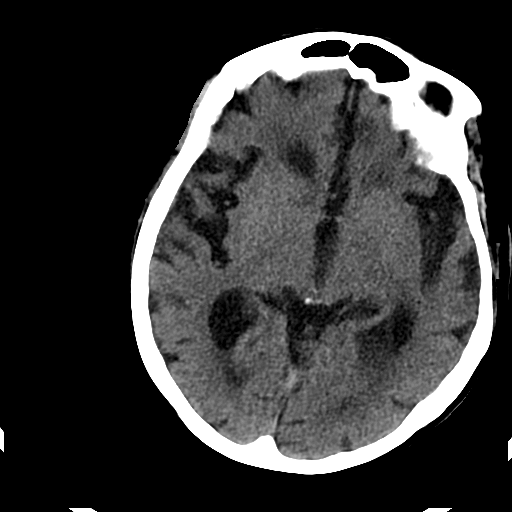
[im 18/32  brain]
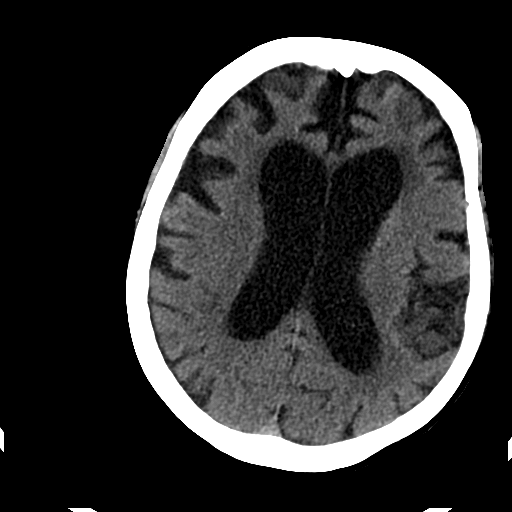
[im 20/32  brain]
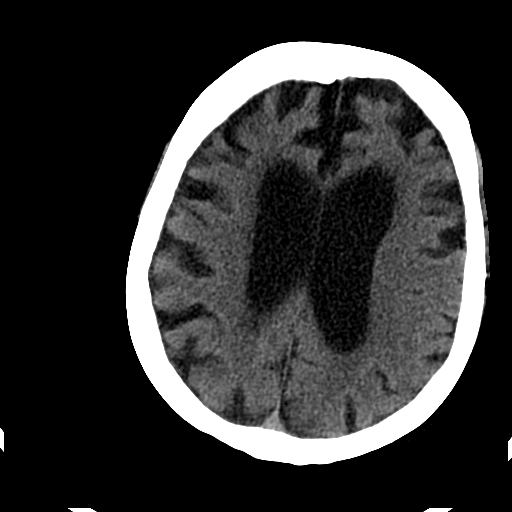
[im 23/32  brain]
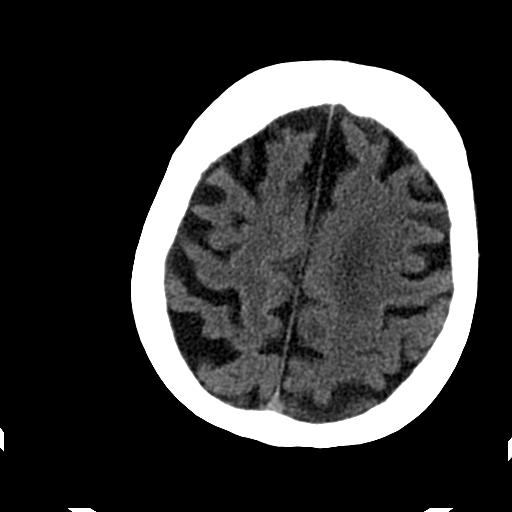
[im 23/32  bone]
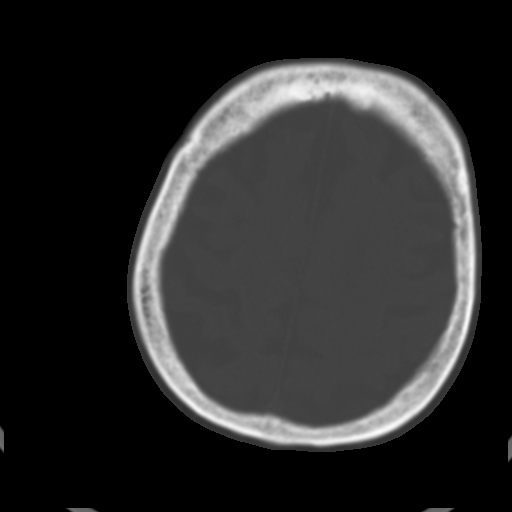
[im 25/32  brain]
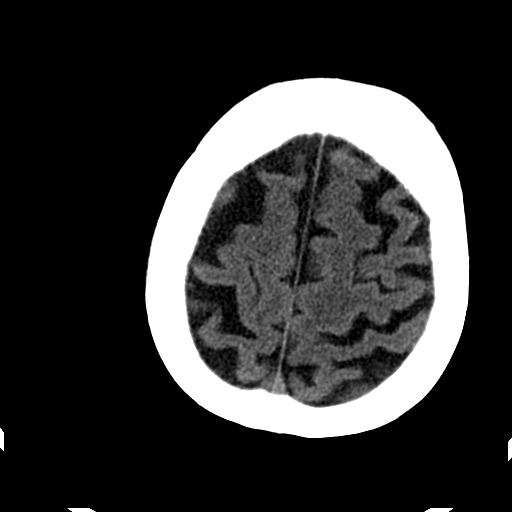
[im 27/32  brain]
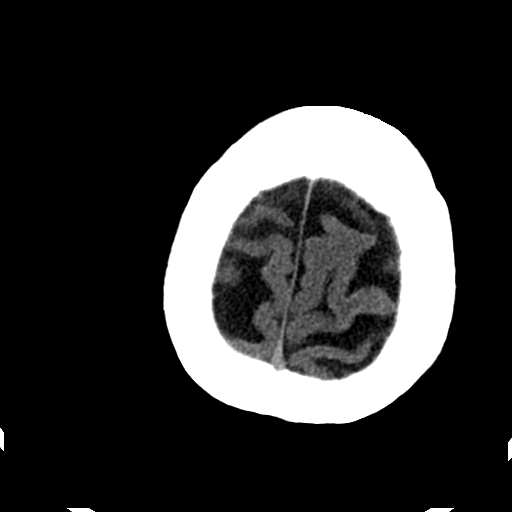
[im 29/32  brain]
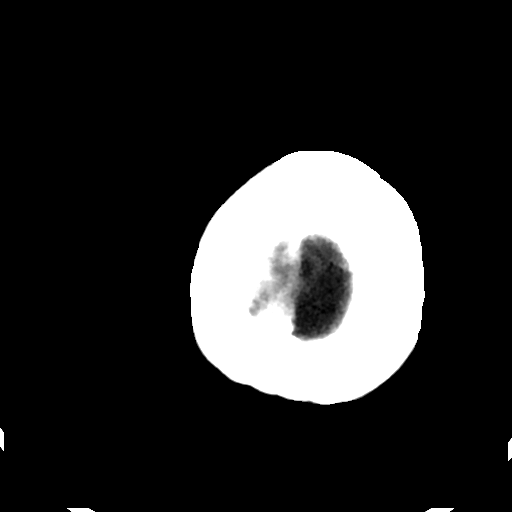

[Series 205: coronal st, idose (1) · coronal · 0.40mm/px · 3 of 66 slices shown]
[im 22/66  brain]
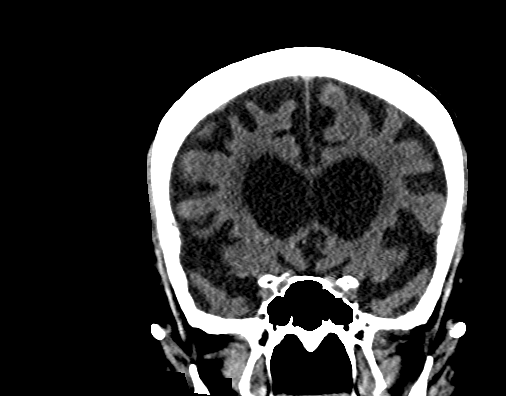
[im 29/66  brain]
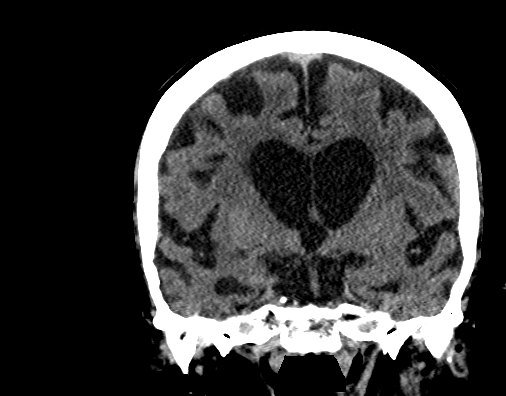
[im 37/66  brain]
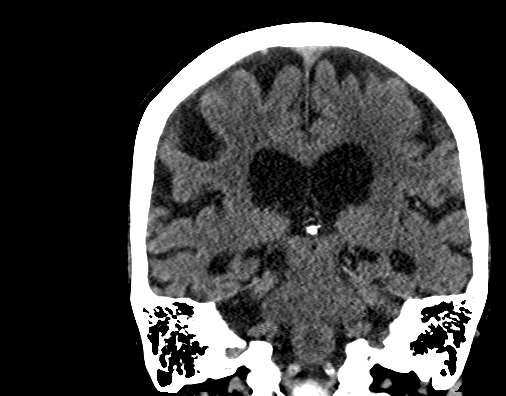

[Series 206: sagittal st, idose (1) · sagittal · 0.40mm/px · 3 of 66 slices shown]
[im 22/66  brain]
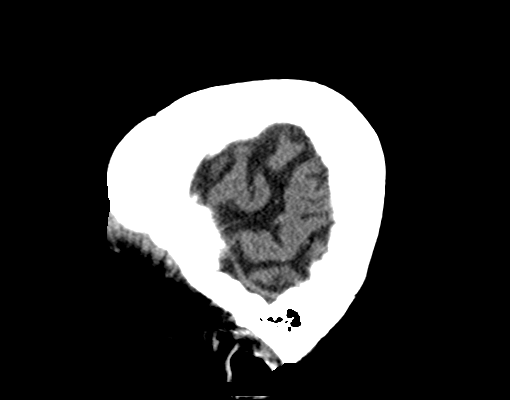
[im 33/66  brain]
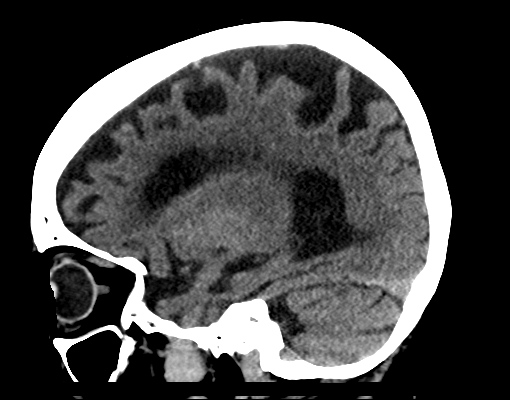
[im 44/66  brain]
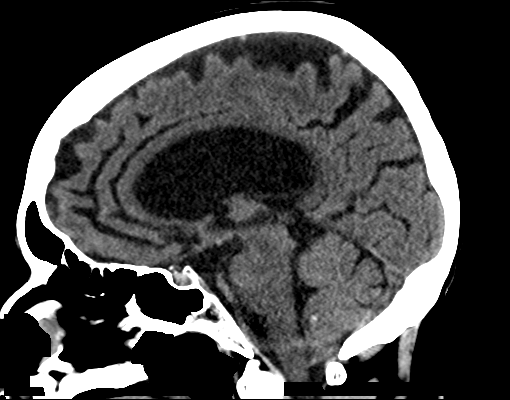

[18 of 47 positions shown; findings below may reference images not displayed]

FINDINGS: Brain: Ventricles and sulci are prominent compatible with atrophy.
Periventricular and subcortical white matter hypodensity compatible
with chronic microvascular ischemic changes. No evidence for acute
cortically based infarct, intracranial hemorrhage, mass lesion or
mass-effect.

Vascular: No hyperdense vessel or unexpected calcification.

Skull: Normal. Negative for fracture or focal lesion.

Sinuses/Orbits: Paranasal sinuses are unremarkable. Mastoid air
cells are unremarkable.

Other: None.
IMPRESSION: No acute intracranial process.

Chronic microvascular ischemic changes and cortical atrophy.

## 2018-03-20 DEATH — deceased
